# Patient Record
Sex: Male | Born: 1975 | Race: Black or African American | Hispanic: No | Marital: Single | Smoking: Never smoker
Health system: Southern US, Community
[De-identification: ages and names within clinical notes are randomized; demographics above are authoritative.]

## PROBLEM LIST (undated history)

## (undated) DIAGNOSIS — N451 Epididymitis: Secondary | ICD-10-CM

## (undated) DIAGNOSIS — I10 Essential (primary) hypertension: Secondary | ICD-10-CM

## (undated) DIAGNOSIS — R972 Elevated prostate specific antigen [PSA]: Secondary | ICD-10-CM

---

## 1998-12-14 ENCOUNTER — Emergency Department (HOSPITAL_COMMUNITY): Admission: EM | Admit: 1998-12-14 | Discharge: 1998-12-14 | Payer: Self-pay | Admitting: Emergency Medicine

## 2002-12-11 ENCOUNTER — Emergency Department (HOSPITAL_COMMUNITY): Admission: EM | Admit: 2002-12-11 | Discharge: 2002-12-12 | Payer: Self-pay | Admitting: Emergency Medicine

## 2008-03-09 ENCOUNTER — Emergency Department (HOSPITAL_COMMUNITY): Admission: EM | Admit: 2008-03-09 | Discharge: 2008-03-09 | Payer: Self-pay | Admitting: Emergency Medicine

## 2010-11-16 ENCOUNTER — Encounter (INDEPENDENT_AMBULATORY_CARE_PROVIDER_SITE_OTHER): Payer: Self-pay | Admitting: *Deleted

## 2010-11-16 LAB — CONVERTED CEMR LAB
LDL Cholesterol: 109 mg/dL — ABNORMAL HIGH (ref 0–99)
Triglycerides: 109 mg/dL (ref <150)

## 2011-05-24 LAB — URINALYSIS, ROUTINE W REFLEX MICROSCOPIC
Bilirubin Urine: NEGATIVE
Nitrite: NEGATIVE
Specific Gravity, Urine: 1.024
pH: 5.5

## 2011-05-24 LAB — URINE MICROSCOPIC-ADD ON

## 2011-09-24 ENCOUNTER — Emergency Department (INDEPENDENT_AMBULATORY_CARE_PROVIDER_SITE_OTHER): Payer: PRIVATE HEALTH INSURANCE

## 2011-09-24 ENCOUNTER — Other Ambulatory Visit: Payer: Self-pay

## 2011-09-24 ENCOUNTER — Emergency Department (HOSPITAL_BASED_OUTPATIENT_CLINIC_OR_DEPARTMENT_OTHER)
Admission: EM | Admit: 2011-09-24 | Discharge: 2011-09-24 | Disposition: A | Discharge status: Home / Self Care | Payer: PRIVATE HEALTH INSURANCE | Attending: Emergency Medicine | Admitting: Emergency Medicine

## 2011-09-24 ENCOUNTER — Encounter (HOSPITAL_BASED_OUTPATIENT_CLINIC_OR_DEPARTMENT_OTHER): Payer: Self-pay | Admitting: *Deleted

## 2011-09-24 DIAGNOSIS — R0609 Other forms of dyspnea: Secondary | ICD-10-CM

## 2011-09-24 DIAGNOSIS — F17200 Nicotine dependence, unspecified, uncomplicated: Secondary | ICD-10-CM

## 2011-09-24 DIAGNOSIS — R091 Pleurisy: Secondary | ICD-10-CM | POA: Insufficient documentation

## 2011-09-24 DIAGNOSIS — R0602 Shortness of breath: Secondary | ICD-10-CM

## 2011-09-24 DIAGNOSIS — J9819 Other pulmonary collapse: Secondary | ICD-10-CM

## 2011-09-24 LAB — BASIC METABOLIC PANEL
BUN: 17 mg/dL (ref 6–23)
CO2: 27 mEq/L (ref 19–32)
Chloride: 106 mEq/L (ref 96–112)
Glucose, Bld: 85 mg/dL (ref 70–99)
Potassium: 4.1 mEq/L (ref 3.5–5.1)

## 2011-09-24 MED ORDER — OXYCODONE-ACETAMINOPHEN 5-325 MG PO TABS: 1.0000 | ORAL_TABLET | ORAL | Status: AC | PRN

## 2011-09-24 NOTE — ED Notes (Signed)
Pt sts he had a cold or the flu 2 weeks ago and symptoms have resolved. Pt sts the SOB returned a couple of days ago.

## 2011-09-24 NOTE — ED Provider Notes (Signed)
History     CSN: [id number]  Arrival date & time 09/24/11  0127   First MD Initiated Contact with Patient 09/24/11 0305      Chief Complaint  Patient presents with  . Shortness of Breath     Patient is a 36 y.o. male presenting with shortness of breath. The history is provided by the patient.  Shortness of Breath  The current episode started 3 to 5 days ago. The problem occurs rarely. The problem has been unchanged. The problem is mild. The symptoms are relieved by nothing. The symptoms are aggravated by nothing. Associated symptoms include rhinorrhea, cough and shortness of breath. Pertinent negatives include no chest pain, no chest pressure and no fever.  pt reports recent flu like illness with cough and rhinorrhea He reports his cough continues (mostly nonproductive) and now notices SOB for past several days He does not report dyspnea on exertion No orthopnea reported He has no anterior chest pain, but he does have left posterior rib pain mostly with deep inspiration No back pain with palpation No diaphoresis reported No h/o CAD/PE No recent travel/surgery reported  PMH - none  History reviewed. No pertinent past surgical history.  No family history on file.  History  Substance Use Topics  . Smoking status: Never Smoker   . Smokeless tobacco: Not on file  . Alcohol Use: No      Review of Systems  Constitutional: Negative for fever.  HENT: Positive for rhinorrhea.   Respiratory: Positive for cough and shortness of breath.   Cardiovascular: Negative for chest pain.  All other systems reviewed and are negative.    Allergies  Review of patient's allergies indicates no known allergies.  Home Medications   Current Outpatient Rx  Name Route Sig Dispense Refill  . OXYCODONE-ACETAMINOPHEN 5-325 MG PO TABS Oral Take 1 tablet by mouth every 4 (four) hours as needed for pain. 15 tablet 0    BP 134/68  Pulse 89  Temp(Src) 98.7 F (37.1 C) (Oral)  Resp 20  SpO2  96%  Physical Exam CONSTITUTIONAL: Well developed/well nourished HEAD AND FACE: Normocephalic/atraumatic EYES: EOMI/PERRL ENMT: Mucous membranes moist NECK: supple no meningeal signs SPINE:entire spine nontender No tenderness noted to paraspinal region CV: S1/S2 noted, no murmurs/rubs/gallops noted Chest - nontender to palpation LUNGS: Lungs are clear to auscultation bilaterally, no apparent distress ABDOMEN: soft, nontender, no rebound or guarding GU:no cva tenderness NEURO: Pt is awake/alert, moves all extremitiesx4 EXTREMITIES: pulses normal, full ROM, no edema noted to LE SKIN: warm, color normal PSYCH: no abnormalities of mood noted  ED Course  Procedures   Labs Reviewed  BASIC METABOLIC PANEL - Abnormal; Notable for the following:    GFR calc non Af Amer 76 (*)    GFR calc Af Amer 88 (*)    All other components within normal limits  D-DIMER, QUANTITATIVE   Dg Chest 2 View  09/24/2011  *RADIOLOGY REPORT*  Clinical Data: Shortness of breath, smoker, dyspnea  CHEST - 2 VIEW  Comparison: None  Findings: Normal heart size, mediastinal contours, and pulmonary vascularity. Peribronchial thickening. Decreased lung volumes with minimal basilar atelectasis. No definite infiltrate, pleural effusion, or pneumothorax. No acute osseous findings.  IMPRESSION: Minimal bronchitic changes and basilar atelectasis.  Original Report Authenticated By: Lollie Marrow, M.D.     1. Pleurisy      Pt well appearing,resting comfortably, low risk for PE and this ruled out by ddimer Doubt ACS at this time . Doubt occult pneumonia BP 134/68  Pulse 89  Temp(Src) 98.7 F (37.1 C) (Oral)  Resp 20  SpO2 96% Stable for d/c  The patient appears reasonably screened and/or stabilized for discharge and I doubt any other medical condition or other [facility] requiring further screening, evaluation, or treatment in the ED at this time prior to discharge.    MDM  Nursing notes reviewed and considered in  documentation All labs/vitals reviewed and considered xrays reviewed and considered  Results for orders placed during the hospital encounter of 09/24/11  BASIC METABOLIC PANEL      Component Value Range   Sodium 142  135 - 145 (mEq/L)   Potassium 4.1  3.5 - 5.1 (mEq/L)   Chloride 106  96 - 112 (mEq/L)   CO2 27  19 - 32 (mEq/L)   Glucose, Bld 85  70 - 99 (mg/dL)   BUN 17  6 - 23 (mg/dL)   Creatinine, Ser 1.61  0.50 - 1.35 (mg/dL)   Calcium 9.6  8.4 - 09.6 (mg/dL)   GFR calc non Af Amer 76 (*) >90 (mL/min)   GFR calc Af Amer 88 (*) >90 (mL/min)  D-DIMER, QUANTITATIVE      Component Value Range   D-Dimer, Quant <0.22  0.00 - 0.48 (ug/mL-FEU)         Date: 09/24/2011  Rate: 83  Rhythm: normal sinus rhythm  QRS Axis: normal  Intervals: normal  ST/T Wave abnormalities: inverted t waves inferiorly  Conduction Disutrbances:none  Narrative Interpretation:   Old EKG Reviewed: none available Baseline artifact but no acute process noted on EKG   Joya Gaskins, MD 09/24/11 [phone]

## 2011-09-24 NOTE — ED Notes (Signed)
Pt, discharged to home

## 2012-10-02 ENCOUNTER — Encounter (HOSPITAL_BASED_OUTPATIENT_CLINIC_OR_DEPARTMENT_OTHER): Payer: Self-pay

## 2012-10-02 ENCOUNTER — Emergency Department (HOSPITAL_BASED_OUTPATIENT_CLINIC_OR_DEPARTMENT_OTHER)
Admission: EM | Admit: 2012-10-02 | Discharge: 2012-10-02 | Disposition: A | Discharge status: Home / Self Care | Payer: Self-pay | Attending: Emergency Medicine | Admitting: Emergency Medicine

## 2012-10-02 DIAGNOSIS — N451 Epididymitis: Secondary | ICD-10-CM

## 2012-10-02 DIAGNOSIS — N453 Epididymo-orchitis: Secondary | ICD-10-CM | POA: Insufficient documentation

## 2012-10-02 LAB — URINALYSIS, ROUTINE W REFLEX MICROSCOPIC
Glucose, UA: 100 mg/dL — AB
Hgb urine dipstick: NEGATIVE
Specific Gravity, Urine: 1.029 (ref 1.005–1.030)
Urobilinogen, UA: 0.2 mg/dL (ref 0.0–1.0)
pH: 5.5 (ref 5.0–8.0)

## 2012-10-02 MED ORDER — TRAMADOL HCL 50 MG PO TABS: 50.0000 mg | ORAL_TABLET | Freq: Four times a day (QID) | ORAL | Status: DC | PRN

## 2012-10-02 MED ORDER — IBUPROFEN 600 MG PO TABS: 600.0000 mg | ORAL_TABLET | Freq: Four times a day (QID) | ORAL | Status: DC | PRN

## 2012-10-02 MED ORDER — DOXYCYCLINE HYCLATE 100 MG PO CAPS: 100.0000 mg | ORAL_CAPSULE | Freq: Two times a day (BID) | ORAL | Status: DC

## 2012-10-02 MED ORDER — LIDOCAINE HCL (PF) 1 % IJ SOLN
INTRAMUSCULAR | Status: AC
  Administered 2012-10-02: 2 mL
  Filled 2012-10-02: qty 5

## 2012-10-02 MED ORDER — CEFTRIAXONE SODIUM 250 MG IJ SOLR
250.0000 mg | Freq: Once | INTRAMUSCULAR | Status: AC
  Administered 2012-10-02: 250 mg via INTRAMUSCULAR
  Filled 2012-10-02: qty 250

## 2012-10-02 NOTE — ED Notes (Signed)
MD at bedside. 

## 2012-10-02 NOTE — ED Provider Notes (Signed)
History  This chart was scribed for Jake Racer, MD by Bennett Scrape, ED Scribe. This patient was seen in room MH06/[facility]06 and the patient's care was started at 10:20 PM.  CSN: [id number]  Arrival date & time 10/02/12  2208   First MD Initiated Contact with Patient 10/02/12 2220      Chief Complaint  Patient presents with  . Testicle Pain    Patient is a 37 y.o. male presenting with testicular pain. The history is provided by the patient. No language interpreter was used.  Testicle Pain This is a new problem. The current episode started more than 2 days ago. The problem occurs constantly. The problem has been gradually worsening. The symptoms are aggravated by walking. The symptoms are relieved by rest. He has tried nothing for the symptoms.    Jake Lopez is a 37 y.o. male who presents to the Emergency Department complaining of 4 days of gradual onset, gradually worsening, constant left testicular pain. He states that walking aggravates the pain and rest improves the pain. He denies any recent trauma or falls. He states that he is sexually active and uses protection "sometimes". He reports having a h/o STDs with similar symptoms in the past. He denies fevers, chills, penile discharge or urinary symptoms. He does not have a h/o chronic medical conditions and denies smoking and alcohol use.  History reviewed. No pertinent past medical history.  History reviewed. No pertinent past surgical history.  No family history on file.  History  Substance Use Topics  . Smoking status: Never Smoker   . Smokeless tobacco: Not on file  . Alcohol Use: No      Review of Systems  Constitutional: Negative for fever and chills.  Gastrointestinal: Negative for nausea and vomiting.  Genitourinary: Positive for testicular pain (left). Negative for dysuria, discharge and penile pain.  Musculoskeletal: Negative for back pain.  All other systems reviewed and are negative.    Allergies   Review of patient's allergies indicates no known allergies.  Home Medications   Current Outpatient Rx  Name  Route  Sig  Dispense  Refill  . DOXYCYCLINE HYCLATE 100 MG PO CAPS   Oral   Take 1 capsule (100 mg total) by mouth 2 (two) times daily.   20 capsule   0   . IBUPROFEN 600 MG PO TABS   Oral   Take 1 tablet (600 mg total) by mouth every 6 (six) hours as needed for pain.   30 tablet   0   . TRAMADOL HCL 50 MG PO TABS   Oral   Take 1 tablet (50 mg total) by mouth every 6 (six) hours as needed for pain.   15 tablet   0     Triage Vitals: BP 184/100  Pulse 90  Temp 98.5 F (36.9 C) (Oral)  Resp 16  Ht 6' (1.829 m)  Wt 250 lb (113.399 kg)  BMI 33.91 kg/m2  SpO2 100%  Physical Exam  Nursing note and vitals reviewed. Constitutional: He is oriented to person, place, and time. He appears well-developed and well-nourished. No distress.  HENT:  Head: Normocephalic and atraumatic.  Eyes: EOM are normal.  Neck: Neck supple. No tracheal deviation present.  Cardiovascular: Normal rate.   Pulmonary/Chest: Effort normal. No respiratory distress.  Genitourinary:       Mild lateral testicular tenderness to palpation, mild penile discharge, testicles are in a normal lie, no signs of cellulitis, cremasteric reflex intact, negative phren's sign  Musculoskeletal: Normal  range of motion.  Neurological: He is alert and oriented to person, place, and time.  Skin: Skin is warm and dry.  Psychiatric: He has a normal mood and affect. His behavior is normal.    ED Course  Procedures (including critical care time)  DIAGNOSTIC STUDIES: Oxygen Saturation is 100% on room air, normal by my interpretation.    COORDINATION OF CARE: 10:24 PM-Advised pt that I don't believe his symptoms are consistent with torsion. Discussed discharge plan which includes antibiotics, pain medication and f/u urology with pt at bedside and pt agreed to plan. Advised pt to return for worsening symptoms or  development of fevers.   Labs Reviewed  URINALYSIS, ROUTINE W REFLEX MICROSCOPIC - Abnormal; Notable for the following:    Glucose, UA 100 (*)     All other components within normal limits   No results found.   1. Epididymitis       MDM  I personally performed the services described in this documentation, which was scribed in my presence. The recorded information has been reviewed and is accurate.        Jake Racer, MD 10/02/12 2250

## 2012-10-02 NOTE — ED Notes (Signed)
Pt complains of left testicle pain that started on Tuesday.  Denies injury.  States "just woke up and it was hurting"

## 2013-02-22 ENCOUNTER — Emergency Department (HOSPITAL_BASED_OUTPATIENT_CLINIC_OR_DEPARTMENT_OTHER): Payer: Self-pay

## 2013-02-22 ENCOUNTER — Encounter (HOSPITAL_BASED_OUTPATIENT_CLINIC_OR_DEPARTMENT_OTHER): Payer: Self-pay

## 2013-02-22 ENCOUNTER — Emergency Department (HOSPITAL_BASED_OUTPATIENT_CLINIC_OR_DEPARTMENT_OTHER)
Admission: EM | Admit: 2013-02-22 | Discharge: 2013-02-22 | Disposition: A | Discharge status: Home / Self Care | Payer: Self-pay | Attending: Emergency Medicine | Admitting: Emergency Medicine

## 2013-02-22 DIAGNOSIS — R1032 Left lower quadrant pain: Secondary | ICD-10-CM | POA: Insufficient documentation

## 2013-02-22 LAB — BASIC METABOLIC PANEL
CO2: 25 mEq/L (ref 19–32)
Chloride: 104 mEq/L (ref 96–112)
GFR calc non Af Amer: 68 mL/min — ABNORMAL LOW (ref >90)
Glucose, Bld: 104 mg/dL — ABNORMAL HIGH (ref 70–99)
Potassium: 4 mEq/L (ref 3.5–5.1)
Sodium: 141 mEq/L (ref 135–145)

## 2013-02-22 LAB — URINALYSIS, ROUTINE W REFLEX MICROSCOPIC
Hgb urine dipstick: NEGATIVE
Specific Gravity, Urine: 1.037 — ABNORMAL HIGH (ref 1.005–1.030)
Urobilinogen, UA: 0.2 mg/dL (ref 0.0–1.0)
pH: 5.5 (ref 5.0–8.0)

## 2013-02-22 MED ORDER — AZITHROMYCIN 250 MG PO TABS
1000.0000 mg | ORAL_TABLET | Freq: Once | ORAL | Status: AC
  Administered 2013-02-22: 1000 mg via ORAL
  Filled 2013-02-22: qty 4

## 2013-02-22 MED ORDER — HYDROCODONE-ACETAMINOPHEN 5-325 MG PO TABS: 1.0000 | ORAL_TABLET | Freq: Four times a day (QID) | ORAL | Status: DC | PRN

## 2013-02-22 MED ORDER — AZITHROMYCIN 1 G PO PACK: 1.0000 g | PACK | Freq: Once | ORAL | Status: DC

## 2013-02-22 MED ORDER — CEFTRIAXONE SODIUM 250 MG IJ SOLR
250.0000 mg | Freq: Once | INTRAMUSCULAR | Status: AC
  Administered 2013-02-22: 250 mg via INTRAMUSCULAR
  Filled 2013-02-22: qty 250

## 2013-02-22 MED ORDER — KETOROLAC TROMETHAMINE 60 MG/2ML IM SOLN
60.0000 mg | Freq: Once | INTRAMUSCULAR | Status: AC
  Administered 2013-02-22: 60 mg via INTRAMUSCULAR
  Filled 2013-02-22: qty 2

## 2013-02-22 MED ORDER — DOXYCYCLINE HYCLATE 100 MG PO CAPS: 100.0000 mg | ORAL_CAPSULE | Freq: Two times a day (BID) | ORAL | Status: DC

## 2013-02-22 NOTE — ED Provider Notes (Signed)
History    CSN: [id number] Arrival date & time 02/22/13  0254  First MD Initiated Contact with Patient 02/22/13 [phone]     Chief Complaint  Patient presents with  . Abdominal Pain   (Consider location/radiation/quality/duration/timing/severity/associated sxs/prior Treatment) Patient is a 37 y.o. male presenting with abdominal pain. The history is provided by the patient.  Abdominal Pain This is a recurrent problem. The current episode started yesterday. The problem occurs constantly. The problem has not changed since onset.Associated symptoms include abdominal pain. Nothing aggravates the symptoms. Nothing relieves the symptoms. He has tried nothing for the symptoms. The treatment provided no relief.  Pain is LLQ that radiated to the inguinal area and left testicle History reviewed. No pertinent past medical history. History reviewed. No pertinent past surgical history. No family history on file. History  Substance Use Topics  . Smoking status: Never Smoker   . Smokeless tobacco: Not on file  . Alcohol Use: No    Review of Systems  Gastrointestinal: Positive for abdominal pain.  Genitourinary: Negative for frequency, hematuria, genital sores and penile pain.  All other systems reviewed and are negative.    Allergies  Review of patient's allergies indicates no known allergies.  Home Medications  No current outpatient prescriptions on file. BP 155/101  Pulse 60  Temp(Src) 98.4 F (36.9 C) (Oral)  Resp 18  Wt 248 lb (112.492 kg)  BMI 33.63 kg/m2  SpO2 100% Physical Exam  Constitutional: He is oriented to person, place, and time. He appears well-developed and well-nourished. No distress.  HENT:  Head: Normocephalic and atraumatic.  Mouth/Throat: Oropharynx is clear and moist.  Eyes: Conjunctivae are normal. Pupils are equal, round, and reactive to light.  Neck: Normal range of motion. Neck supple.  Cardiovascular: Normal rate, regular rhythm and intact distal pulses.    Pulmonary/Chest: Effort normal and breath sounds normal.  Abdominal: Soft. Bowel sounds are increased. There is no tenderness. There is no rebound and no guarding.    Genitourinary:  B testicles vertically aligned, not swollen non tender, no masses, positive cremasteric reflex.  No hernias  Musculoskeletal: Normal range of motion.  Neurological: He is alert and oriented to person, place, and time.  Skin: Skin is warm and dry.  Psychiatric: He has a normal mood and affect.    ED Course  Procedures (including critical care time) Labs Reviewed  URINALYSIS, ROUTINE W REFLEX MICROSCOPIC - Abnormal; Notable for the following:    Specific Gravity, Urine 1.037 (*)    All other components within normal limits  BASIC METABOLIC PANEL   Ct Abdomen Pelvis Wo Contrast  02/22/2013   *RADIOLOGY REPORT*  Clinical Data: Abdominal pain.  Left lower quadrant pain.  CT ABDOMEN AND PELVIS WITHOUT CONTRAST  Technique:  Multidetector CT imaging of the abdomen and pelvis was performed following the standard protocol without intravenous contrast.  Comparison: None.  Findings: Lung Bases: Subsegmental atelectasis.  Liver:  Unenhanced CT was performed per clinician order.  Lack of IV contrast limits sensitivity and specificity, especially for evaluation of abdominal/pelvic solid viscera.  Grossly normal.  Spleen:  Normal.  Gallbladder:  Contracted.  Common bile duct:  Normal.  Pancreas:  Normal.  Adrenal glands:  Normal.  Kidneys:  No renal calculi or hydronephrosis.  Both ureters are within normal limits.  Stomach:  Normal.  Small bowel:  Normal.  Nonspecific misty mesentery.  No adenopathy. No obstruction.  Colon:   Normal appendix.  No inflammatory changes of colon.  Pelvic Genitourinary:  Decompressed  urinary bladder.  No adenopathy.  No free fluid.  Bones:  No aggressive osseous lesions.  Vasculature: Grossly normal.  Body Wall: Normal.  IMPRESSION: No acute abnormality.   Original Report Authenticated By: Andreas Newport, M.D.   1. Inguinal pain, lower left quadrant     MDM  No penile discharge but has had unprotected encounters will treat.  Will provide pain medication.  I do not believe the pain is torsion nor epidydimitis.  I believe the pain is referred from the [facility]    Jillane Po Smitty Cords, MD 02/22/13 [phone]

## 2013-02-22 NOTE — ED Notes (Signed)
Patient here with LLQ abdominal pain and pain to testicles since early yesterday. No nausea, no urinary symptoms. Reports similar event in past and took antibiotics.

## 2013-11-30 ENCOUNTER — Emergency Department (HOSPITAL_BASED_OUTPATIENT_CLINIC_OR_DEPARTMENT_OTHER)
Admission: EM | Admit: 2013-11-30 | Discharge: 2013-11-30 | Disposition: A | Discharge status: Home / Self Care | Payer: Self-pay | Attending: Emergency Medicine | Admitting: Emergency Medicine

## 2013-11-30 ENCOUNTER — Encounter (HOSPITAL_BASED_OUTPATIENT_CLINIC_OR_DEPARTMENT_OTHER): Payer: Self-pay | Admitting: Emergency Medicine

## 2013-11-30 DIAGNOSIS — N453 Epididymo-orchitis: Secondary | ICD-10-CM | POA: Insufficient documentation

## 2013-11-30 DIAGNOSIS — N451 Epididymitis: Secondary | ICD-10-CM

## 2013-11-30 LAB — URINALYSIS, ROUTINE W REFLEX MICROSCOPIC
BILIRUBIN URINE: NEGATIVE
Glucose, UA: NEGATIVE mg/dL
HGB URINE DIPSTICK: NEGATIVE
KETONES UR: NEGATIVE mg/dL
Leukocytes, UA: NEGATIVE
NITRITE: NEGATIVE
PH: 5.5 (ref 5.0–8.0)
Protein, ur: NEGATIVE mg/dL
SPECIFIC GRAVITY, URINE: 1.025 (ref 1.005–1.030)
Urobilinogen, UA: 0.2 mg/dL (ref 0.0–1.0)

## 2013-11-30 LAB — GC/CHLAMYDIA PROBE AMP
CT Probe RNA: NEGATIVE
GC PROBE AMP APTIMA: NEGATIVE

## 2013-11-30 MED ORDER — DOXYCYCLINE HYCLATE 100 MG PO CAPS: 100.0000 mg | ORAL_CAPSULE | Freq: Two times a day (BID) | ORAL | Status: DC

## 2013-11-30 MED ORDER — AZITHROMYCIN 1 G PO PACK
1.0000 g | PACK | Freq: Once | ORAL | Status: AC
  Administered 2013-11-30: 1 g via ORAL
  Filled 2013-11-30: qty 1

## 2013-11-30 MED ORDER — NAPROXEN 500 MG PO TABS: 500.0000 mg | ORAL_TABLET | Freq: Two times a day (BID) | ORAL | Status: DC

## 2013-11-30 MED ORDER — CEFTRIAXONE SODIUM 250 MG IJ SOLR
250.0000 mg | Freq: Once | INTRAMUSCULAR | Status: AC
  Administered 2013-11-30: 250 mg via INTRAMUSCULAR
  Filled 2013-11-30: qty 250

## 2013-11-30 NOTE — ED Provider Notes (Signed)
CSN: [id number]     Arrival date & time 11/30/13  0052 History   First MD Initiated Contact with Patient 11/30/13 0112     Chief Complaint  Patient presents with  . Testicle Pain     (Consider location/radiation/quality/duration/timing/severity/associated sxs/prior Treatment) Patient is a 38 y.o. male presenting with testicular pain. The history is provided by the patient.  Testicle Pain This is a recurrent problem. The current episode started more than 1 week ago. The problem occurs constantly. The problem has not changed since onset.Pertinent negatives include no chest pain, no abdominal pain, no headaches and no shortness of breath. Nothing aggravates the symptoms. Nothing relieves the symptoms. He has tried nothing for the symptoms. The treatment provided no relief.    History reviewed. No pertinent past medical history. History reviewed. No pertinent past surgical history. History reviewed. No pertinent family history. History  Substance Use Topics  . Smoking status: Never Smoker   . Smokeless tobacco: Not on file  . Alcohol Use: No    Review of Systems  Respiratory: Negative for shortness of breath.   Cardiovascular: Negative for chest pain.  Gastrointestinal: Negative for abdominal pain.  Genitourinary: Positive for testicular pain. Negative for frequency, discharge, penile swelling and difficulty urinating.  Neurological: Negative for headaches.  All other systems reviewed and are negative.      Allergies  Review of patient's allergies indicates no known allergies.  Home Medications   Current Outpatient Rx  Name  Route  Sig  Dispense  Refill  . doxycycline (VIBRAMYCIN) 100 MG capsule   Oral   Take 1 capsule (100 mg total) by mouth 2 (two) times daily. One po bid x 7 days   14 capsule   0   . HYDROcodone-acetaminophen (NORCO) 5-325 MG per tablet   Oral   Take 1 tablet by mouth every 6 (six) hours as needed for pain.   10 tablet   0    BP 161/111  Pulse  68  Temp(Src) 98.5 F (36.9 C) (Oral)  Resp 18  SpO2 95% Physical Exam  Constitutional: He is oriented to person, place, and time. He appears well-developed and well-nourished. No distress.  HENT:  Head: Normocephalic and atraumatic.  Eyes: Conjunctivae and EOM are normal.  Neck: Normal range of motion. Neck supple.  Cardiovascular: Normal rate, regular rhythm and intact distal pulses.   Pulmonary/Chest: Effort normal and breath sounds normal. He has no wheezes. He has no rales.  Abdominal: Soft. Bowel sounds are normal. There is no tenderness. There is no rebound and no guarding.  Genitourinary: Penis normal. No penile tenderness.  Chaperone present.  Has cremasteric reflex.  No swelling no discharge  Musculoskeletal: Normal range of motion.  Neurological: He is alert and oriented to person, place, and time.  Skin: Skin is warm and dry.  Psychiatric: He has a normal mood and affect.    ED Course  Procedures (including critical care time) Labs Review Labs Reviewed  URINALYSIS, ROUTINE W REFLEX MICROSCOPIC   Imaging Review No results found.   EKG Interpretation None      MDM   Final diagnoses:  None    Symptoms consistent with epidydimitis.  As patient has been treated for STI and has had unprotected encounters will treat again.  Follow up with urology no sexual activity until all partners treated    Teodor Prater Smitty CordsK Cedarius Kersh-Rasch, MD 11/30/13 [id number]

## 2013-11-30 NOTE — Discharge Instructions (Signed)
Epididymitis  Epididymitis is a swelling (inflammation) of the epididymis. The epididymis is a cord-like structure along the back part of the testicle. Epididymitis is usually, but not always, caused by infection. This is usually a sudden problem beginning with chills, fever and pain behind the scrotum and in the testicle. There may be swelling and redness of the testicle.  DIAGNOSIS   Physical examination will reveal a tender, swollen epididymis. Sometimes, cultures are obtained from the urine or from prostate secretions to help find out if there is an infection or if the cause is a different problem. Sometimes, blood work is performed to see if your white blood cell count is elevated and if a germ (bacterial) or viral infection is present. Using this knowledge, an appropriate medicine which kills germs (antibiotic) can be chosen by your caregiver. A viral infection causing epididymitis will most often go away (resolve) without treatment.  HOME CARE INSTRUCTIONS   · Hot sitz baths for 20 minutes, 4 times per day, may help relieve pain.  · Only take over-the-counter or prescription medicines for pain, discomfort or fever as directed by your caregiver.  · Take all medicines, including antibiotics, as directed. Take the antibiotics for the full prescribed length of time even if you are feeling better.  · It is very important to keep all follow-up appointments.  SEEK IMMEDIATE MEDICAL CARE IF:   · You have a fever.  · You have pain not relieved with medicines.  · You have any worsening of your problems.  · Your pain seems to come and go.  · You develop pain, redness, and swelling in the scrotum and surrounding areas.  MAKE SURE YOU:   · Understand these instructions.  · Will watch your condition.  · Will get help right away if you are not doing well or get worse.  Document Released: 08/09/2000 Document Revised: 11/04/2011 Document Reviewed: 06/29/2009  [employer]® Patient Information ©2014 [employer].

## 2013-11-30 NOTE — ED Notes (Signed)
Pt states that from last Monday he has had left testicular pain, left neck pain, and left leg pain.  Pt reports that he has had these s/s before and was told it was a STD.

## 2014-07-06 ENCOUNTER — Encounter (HOSPITAL_BASED_OUTPATIENT_CLINIC_OR_DEPARTMENT_OTHER): Payer: Self-pay

## 2014-07-06 ENCOUNTER — Emergency Department (HOSPITAL_BASED_OUTPATIENT_CLINIC_OR_DEPARTMENT_OTHER)
Admission: EM | Admit: 2014-07-06 | Discharge: 2014-07-06 | Disposition: A | Discharge status: Home / Self Care | Payer: Self-pay | Attending: Emergency Medicine | Admitting: Emergency Medicine

## 2014-07-06 ENCOUNTER — Emergency Department (HOSPITAL_BASED_OUTPATIENT_CLINIC_OR_DEPARTMENT_OTHER): Payer: Self-pay

## 2014-07-06 DIAGNOSIS — R6883 Chills (without fever): Secondary | ICD-10-CM | POA: Insufficient documentation

## 2014-07-06 DIAGNOSIS — Z791 Long term (current) use of non-steroidal anti-inflammatories (NSAID): Secondary | ICD-10-CM | POA: Insufficient documentation

## 2014-07-06 DIAGNOSIS — Z792 Long term (current) use of antibiotics: Secondary | ICD-10-CM | POA: Insufficient documentation

## 2014-07-06 DIAGNOSIS — R110 Nausea: Secondary | ICD-10-CM | POA: Insufficient documentation

## 2014-07-06 DIAGNOSIS — N50819 Testicular pain, unspecified: Secondary | ICD-10-CM

## 2014-07-06 DIAGNOSIS — N451 Epididymitis: Secondary | ICD-10-CM | POA: Insufficient documentation

## 2014-07-06 LAB — URINALYSIS, ROUTINE W REFLEX MICROSCOPIC
Bilirubin Urine: NEGATIVE
GLUCOSE, UA: 250 mg/dL — AB
Hgb urine dipstick: NEGATIVE
KETONES UR: NEGATIVE mg/dL
LEUKOCYTES UA: NEGATIVE
NITRITE: NEGATIVE
PH: 5.5 (ref 5.0–8.0)
Protein, ur: NEGATIVE mg/dL
SPECIFIC GRAVITY, URINE: 1.024 (ref 1.005–1.030)
Urobilinogen, UA: 0.2 mg/dL (ref 0.0–1.0)

## 2014-07-06 MED ORDER — HYDROCODONE-ACETAMINOPHEN 5-325 MG PO TABS: 1.0000 | ORAL_TABLET | Freq: Four times a day (QID) | ORAL | Status: DC | PRN

## 2014-07-06 MED ORDER — DOXYCYCLINE HYCLATE 100 MG PO CAPS: 100.0000 mg | ORAL_CAPSULE | Freq: Two times a day (BID) | ORAL | Status: DC

## 2014-07-06 NOTE — ED Provider Notes (Signed)
CSN: [id number]     Arrival date & time 07/06/14  0720 History   First MD Initiated Contact with Patient 07/06/14 [id number]     Chief Complaint  Patient presents with  . Testicle Pain     (Consider location/radiation/quality/duration/timing/severity/associated sxs/prior Treatment) HPI Comments: Pt states that this same thing happened 2 other times this year which improved with abx.  Denies dysuria, hematuria, frequency or urgency.  Sometimes notices some left testicular swelling but no constantly.  No pain with bm's.  Patient is a 38 y.o. male presenting with testicular pain. The history is provided by the patient.  Testicle Pain This is a recurrent problem. Episode onset: 2 weeks. The problem occurs constantly. The problem has been gradually worsening. Associated symptoms include abdominal pain. Pertinent negatives include no chest pain, no headaches and no shortness of breath. Associated symptoms comments: Since the pain in the testicle has worsened it has caused some left lower abd pain and intermittent nausea.  Chills but no fever. Exacerbated by: rest and lying down. Relieved by: does not notice as much when working. Treatments tried: nsaids. The treatment provided no relief.    History reviewed. No pertinent past medical history. History reviewed. No pertinent past surgical history. No family history on file. History  Substance Use Topics  . Smoking status: Never Smoker   . Smokeless tobacco: Not on file  . Alcohol Use: No    Review of Systems  Constitutional: Positive for chills.  Respiratory: Negative for shortness of breath.   Cardiovascular: Negative for chest pain.  Gastrointestinal: Positive for nausea and abdominal pain. Negative for diarrhea and blood in stool.  Genitourinary: Positive for testicular pain. Negative for dysuria, frequency, hematuria, flank pain, discharge, genital sores and penile pain.  Musculoskeletal: Positive for back pain.       Left upper back pain  worse with lifting  Neurological: Negative for headaches.  All other systems reviewed and are negative.     Allergies  Review of patient's allergies indicates no known allergies.  Home Medications   Prior to Admission medications   Medication Sig Start Date End Date Taking? Authorizing Provider  doxycycline (VIBRAMYCIN) 100 MG capsule Take 1 capsule (100 mg total) by mouth 2 (two) times daily. One po bid x 7 days 02/22/13   April K Palumbo-Rasch, MD  doxycycline (VIBRAMYCIN) 100 MG capsule Take 1 capsule (100 mg total) by mouth 2 (two) times daily. One po bid x 10 days 11/30/13   April K Palumbo-Rasch, MD  HYDROcodone-acetaminophen [facility]) 5-325 MG per tablet Take 1 tablet by mouth every 6 (six) hours as needed for pain. 02/22/13   April K Palumbo-Rasch, MD  naproxen (NAPROSYN) 500 MG tablet Take 1 tablet (500 mg total) by mouth 2 (two) times daily. 11/30/13   April K Palumbo-Rasch, MD   BP 158/99 mmHg  Pulse 70  Temp(Src) 98 F (36.7 C) (Oral)  Resp 20  Ht 6' (1.829 m)  Wt 250 lb (113.399 kg)  BMI 33.90 kg/m2  SpO2 100% Physical Exam  Constitutional: He is oriented to person, place, and time. He appears well-developed and well-nourished. No distress.  HENT:  Head: Normocephalic and atraumatic.  Mouth/Throat: Oropharynx is clear and moist.  Eyes: Conjunctivae and EOM are normal. Pupils are equal, round, and reactive to light.  Neck: Normal range of motion. Neck supple.  Cardiovascular: Normal rate, regular rhythm and intact distal pulses.   No murmur heard. Pulmonary/Chest: Effort normal and breath sounds normal. No respiratory distress. He has no  wheezes. He has no rales.  Abdominal: Soft. He exhibits no distension. There is no tenderness. There is no rebound, no guarding and no CVA tenderness. No hernia. Hernia confirmed negative in the left inguinal area.    Mild left lower pelvic pain  Genitourinary: Penis normal. Right testis shows no mass, no swelling and no tenderness.  Right testis is descended. Left testis shows tenderness. Left testis shows no mass and no swelling. Left testis is descended.  Mild tenderness along the posterior aspect of the left testicle  Musculoskeletal: Normal range of motion. He exhibits no edema.       Thoracic back: He exhibits tenderness, pain and spasm. He exhibits no bony tenderness.       Back:  Neurological: He is alert and oriented to person, place, and time.  Skin: Skin is warm and dry. No rash noted. No erythema.  Psychiatric: He has a normal mood and affect. His behavior is normal.  Nursing note and vitals reviewed.   ED Course  Procedures (including critical care time) Labs Review Labs Reviewed  URINALYSIS, ROUTINE W REFLEX MICROSCOPIC - Abnormal; Notable for the following:    Glucose, UA 250 (*)    All other components within normal limits    Imaging Review [place] Scrotum  07/06/2014   CLINICAL DATA:  Left scrotal region pain  EXAM: ULTRASOUND OF SCROTUM  TECHNIQUE: Complete ultrasound examination of the testicles, epididymis, and other scrotal structures was performed.  COMPARISON:  None.  FINDINGS: Right testicle  Measurements: 4.0 x 2.2 x 2.8 cm. No mass or microlithiasis visualized. Color flow is seen in the right testis.  Left testicle  Measurements: 4.0 x 2.1 x 2.9 cm. No mass or microlithiasis visualized. Color flow is seen in the left testis.  Right epididymis:  Normal in size and appearance.  Left epididymis:  Normal in size and appearance.  Hydrocele:  There are minimal hydroceles bilaterally.  Varicocele:  None visualized.  No scrotal wall thickening or abscess on either side.  IMPRESSION: Minimal hydroceles bilaterally. Study otherwise unremarkable. No intratesticular mass or extratesticular mass. No demonstrable torsion or inflammatory focus on either side.   Electronically Signed   By: Bretta BangWilliam  Woodruff M.D.   On: 07/06/2014 08:46     EKG Interpretation None      MDM   Final diagnoses:  Testicle pain      Patient is here complaining of testicular pain has been progressively worsening over the last 2 weeks. He has a prior history of left-sided epididymitis 2 this year. He denies any urinary symptoms and no pain with bowel movements. He states the pain is worse at rest and is now seeming to track into the leg. On exam patient has some mild posterior testicular tenderness without hernia or lesions. Patient has never had an ultrasound that had a CT done in June that was within normal limits. Patient did report that he had back pain however it is upper thoracic back pain consistent with musculoskeletal pain. 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 Feel that this is most likely the cause for his pain. Initially at triage patient states he was having chest pain however he does not mention denies any pain here.  Today's urine is negative except for 250 of glucose. Patient had a BMP done in June that was within normal limits except for a mild elevation in his blood sugar of 104.  Low suspicion for prostatitis and no signs of hernia as  a cause of his pain. Given this his third episode of pain like this will get an ultrasound to ensure there are no other causes for patient's pain.  8:53 AM US unremarkable.  Given sx will treat with doxy and pain control.  Gwyneth SproutWhitney Neel Buffone, MD 07/06/14 [phone]

## 2014-07-06 NOTE — Discharge Instructions (Signed)
Epididymitis °Epididymitis is a swelling (inflammation) of the epididymis. The epididymis is a cord-like structure along the back part of the testicle. Epididymitis is usually, but not always, caused by infection. This is usually a sudden problem beginning with chills, fever and pain behind the scrotum and in the testicle. There may be swelling and redness of the testicle. °DIAGNOSIS  °Physical examination will reveal a tender, swollen epididymis. Sometimes, cultures are obtained from the urine or from prostate secretions to help find out if there is an infection or if the cause is a different problem. Sometimes, blood work is performed to see if your white blood cell count is elevated and if a germ (bacterial) or viral infection is present. Using this knowledge, an appropriate medicine which kills germs (antibiotic) can be chosen by your caregiver. A viral infection causing epididymitis will most often go away (resolve) without treatment. °HOME CARE INSTRUCTIONS  °· Hot sitz baths for 20 minutes, 4 times per day, may help relieve pain. °· Only take over-the-counter or prescription medicines for pain, discomfort or fever as directed by your caregiver. °· Take all medicines, including antibiotics, as directed. Take the antibiotics for the full prescribed length of time even if you are feeling better. °· It is very important to keep all follow-up appointments. °SEEK IMMEDIATE MEDICAL CARE IF:  °· You have a fever. °· You have pain not relieved with medicines. °· You have any worsening of your problems. °· Your pain seems to come and go. °· You develop pain, redness, and swelling in the scrotum and surrounding areas. °MAKE SURE YOU:  °· Understand these instructions. °· Will watch your condition. °· Will get help right away if you are not doing well or get worse. °Document Released: 08/09/2000 Document Revised: 11/04/2011 Document Reviewed: 06/29/2009 °ExitCare® Patient Information ©2015 [employer]. This information  is not intended to replace advice given to you by your health care provider. Make sure you discuss any questions you have with your health care provider. ° °Scrotal Swelling °Scrotal swelling may occur on one or both sides of the scrotum. Pain may also occur with swelling. Possible causes of scrotal swelling include:  °· Injury. °· Infection. °· An ingrown hair or abrasion in the area. °· Repeated rubbing from tight-fitting underwear. °· Poor hygiene. °· A weakened area in the muscles around the groin (hernia). A hernia can allow abdominal contents to push into the scrotum. °· Fluid around the testicle (hydrocele). °· Enlarged vein around the testicle (varicocele). °· Certain medical treatments or existing conditions. °· A recent genital surgery or procedure. °· The spermatic cord becomes twisted in the scrotum, which cuts off blood supply (testicular torsion). °· Testicular cancer. °HOME CARE INSTRUCTIONS °Once the cause of your scrotal swelling has been determined, you may be asked to monitor your scrotum for any changes. The following actions may help to alleviate any discomfort you are experiencing: °· Rest and limit activity until the swelling goes away. Lying down is the preferred position. °· Put ice on the scrotum: °¨ Put ice in a plastic bag. °¨ Place a towel between your skin and the bag. °¨ Leave the ice on for 20 minutes, 2-3 times a day for 1-2 days. °· Place a rolled towel under the testicles for support. °· Wear loose-fitting clothing or an athletic support cup for comfort. °· Take all medicines as directed by your health care provider. °· Perform a monthly self-exam of the scrotum and penis. Feel for changes. Ask your health care provider   how to perform a monthly self-exam if you are unsure. °SEEK MEDICAL CARE IF: °· You have a sudden (acute) onset of pain that is persistent and not improving. °· You notice a heavy feeling or fluid in the scrotum. °· You have pain or burning while urinating. °· You  have blood in the urine or semen. °· You feel a lump around the testicle. °· You notice that one testicle is larger than the other (slight variation is normal). °· You have a persistent dull ache or pain in the groin or scrotum. °SEEK IMMEDIATE MEDICAL CARE IF: °· The pain does not go away or becomes severe. °· You have a fever or shaking chills. °· You have pain or vomiting that cannot be controlled. °· You notice significant redness or swelling of one or both sides of the scrotum. °· You experience redness spreading upward from your scrotum to your abdomen or downward from your scrotum to your thighs. °MAKE SURE YOU: °· Understand these instructions. °· Will watch your condition. °· Will get help right away if you are not doing well or get worse. °Document Released: 09/14/2010 Document Revised: 04/14/2013 Document Reviewed: 01/14/2013 °ExitCare® Patient Information ©2015 [employer]. This information is not intended to replace advice given to you by your health care provider. Make sure you discuss any questions you have with your health care provider. ° °

## 2014-07-06 NOTE — ED Notes (Signed)
Pt reports left testicle pain x 2 weeks.  Reports pain has got worse.  Pt reports hx of same epididymis.  Pt complains of lower back pain and leg pain as well.

## 2016-09-07 ENCOUNTER — Encounter (HOSPITAL_BASED_OUTPATIENT_CLINIC_OR_DEPARTMENT_OTHER): Payer: Self-pay | Admitting: Emergency Medicine

## 2016-09-07 ENCOUNTER — Emergency Department (HOSPITAL_BASED_OUTPATIENT_CLINIC_OR_DEPARTMENT_OTHER)
Admission: EM | Admit: 2016-09-07 | Discharge: 2016-09-07 | Disposition: A | Discharge status: Home / Self Care | Payer: Self-pay | Attending: Emergency Medicine | Admitting: Emergency Medicine

## 2016-09-07 ENCOUNTER — Emergency Department (HOSPITAL_BASED_OUTPATIENT_CLINIC_OR_DEPARTMENT_OTHER): Payer: Self-pay

## 2016-09-07 DIAGNOSIS — R079 Chest pain, unspecified: Secondary | ICD-10-CM | POA: Insufficient documentation

## 2016-09-07 DIAGNOSIS — R531 Weakness: Secondary | ICD-10-CM

## 2016-09-07 DIAGNOSIS — N390 Urinary tract infection, site not specified: Secondary | ICD-10-CM | POA: Insufficient documentation

## 2016-09-07 DIAGNOSIS — R0602 Shortness of breath: Secondary | ICD-10-CM | POA: Insufficient documentation

## 2016-09-07 DIAGNOSIS — M542 Cervicalgia: Secondary | ICD-10-CM | POA: Insufficient documentation

## 2016-09-07 LAB — BASIC METABOLIC PANEL
Anion gap: 7 (ref 5–15)
BUN: 23 mg/dL — ABNORMAL HIGH (ref 6–20)
CO2: 28 mmol/L (ref 22–32)
Calcium: 9.6 mg/dL (ref 8.9–10.3)
Chloride: 105 mmol/L (ref 101–111)
Creatinine, Ser: 1.22 mg/dL (ref 0.61–1.24)
GFR calc Af Amer: >60 mL/min (ref >60)
GFR calc non Af Amer: >60 mL/min (ref >60)
Glucose, Bld: 146 mg/dL — ABNORMAL HIGH (ref 65–99)
Potassium: 4.8 mmol/L (ref 3.5–5.1)
Sodium: 140 mmol/L (ref 135–145)

## 2016-09-07 LAB — URINALYSIS, ROUTINE W REFLEX MICROSCOPIC
Bilirubin Urine: NEGATIVE
Glucose, UA: 100 mg/dL — AB
Hgb urine dipstick: NEGATIVE
Ketones, ur: NEGATIVE mg/dL
Nitrite: NEGATIVE
Protein, ur: NEGATIVE mg/dL
Specific Gravity, Urine: 1.031 — ABNORMAL HIGH (ref 1.005–1.030)
pH: 7 (ref 5.0–8.0)

## 2016-09-07 LAB — URINALYSIS, MICROSCOPIC (REFLEX)

## 2016-09-07 LAB — CBC
HCT: 47.7 % (ref 39.0–52.0)
Hemoglobin: 15.8 g/dL (ref 13.0–17.0)
MCH: 29.6 pg (ref 26.0–34.0)
MCHC: 33.1 g/dL (ref 30.0–36.0)
MCV: 89.5 fL (ref 78.0–100.0)
Platelets: 197 10*3/uL (ref 150–400)
RBC: 5.33 MIL/uL (ref 4.22–5.81)
RDW: 12.7 % (ref 11.5–15.5)
WBC: 7.6 10*3/uL (ref 4.0–10.5)

## 2016-09-07 LAB — TROPONIN I: Troponin I: <0.03 ng/mL (ref <0.03)

## 2016-09-07 MED ORDER — DEXTROSE 5 % IV SOLN
1.0000 g | Freq: Once | INTRAVENOUS | Status: AC
  Administered 2016-09-07: 1 g via INTRAVENOUS
  Filled 2016-09-07: qty 10

## 2016-09-07 MED ORDER — KETOROLAC TROMETHAMINE 15 MG/ML IJ SOLN
15.0000 mg | Freq: Once | INTRAMUSCULAR | Status: AC
Start: 2016-09-07 — End: 2016-09-07
  Administered 2016-09-07: 15 mg via INTRAVENOUS
  Filled 2016-09-07: qty 1

## 2016-09-07 MED ORDER — CEPHALEXIN 500 MG PO CAPS: 500.0000 mg | ORAL_CAPSULE | Freq: Three times a day (TID) | ORAL | 0 refills | Status: DC

## 2016-09-07 MED ORDER — LORAZEPAM 2 MG/ML IJ SOLN
1.0000 mg | Freq: Once | INTRAMUSCULAR | Status: AC
  Administered 2016-09-07: 1 mg via INTRAVENOUS
  Filled 2016-09-07: qty 1

## 2016-09-07 MED ORDER — SODIUM CHLORIDE 0.9 % IV BOLUS (SEPSIS)
1000.0000 mL | Freq: Once | INTRAVENOUS | Status: AC
  Administered 2016-09-07: 1000 mL via INTRAVENOUS

## 2016-09-07 NOTE — ED Notes (Signed)
Pt on automatic VS.  

## 2016-09-07 NOTE — ED Notes (Signed)
Dr Kohut in room with patient now. 

## 2016-09-07 NOTE — ED Notes (Signed)
ED Provider at bedside. 

## 2016-09-07 NOTE — ED Notes (Signed)
Patient transported to X-ray 

## 2016-09-07 NOTE — ED Provider Notes (Signed)
[facility] Provider Note   CSN: [id number] Arrival date & time: 09/07/16  1446   By signing my name below, I, Nelwyn Salisbury, attest that this documentation has been prepared under the direction and in the presence of Raeford Razor, MD . Electronically Signed: Nelwyn Salisbury, Scribe. 09/07/2016. 3:25 PM.  History   Chief Complaint Chief Complaint  Patient presents with  . Chest Pain  . Neck Pain  . Fatigue   The history is provided by the patient. No language interpreter was used.    HPI Comments:  Jake Lopez is a 41 y.o. male with pmhx of HTN who presents to the Emergency Department complaining of gradual-onset, constant, worsening fatigue beginning 5 days ago. Pt notes that he has tried taking breaks and resting with no relief. He reports associated chest pain, nausea, shoulder pain, SOB, upper back pain, and neck pain. Pt notes that he works at a job where he does a lot of heavy lifting. He denies any fever, lower extremity swelling, dysuria or difficulty urinating. Pt was seen 3 days ago at urgent care and told that his blood pressure was very high.   History reviewed. No pertinent past medical history.  There are no active problems to display for this patient.   History reviewed. No pertinent surgical history.   Home Medications    Prior to Admission medications   Medication Sig Start Date End Date Taking? Authorizing Provider  doxycycline (VIBRAMYCIN) 100 MG capsule Take 1 capsule (100 mg total) by mouth 2 (two) times daily. 07/06/14   Gwyneth Sprout, MD  HYDROcodone-acetaminophen (NORCO/VICODIN) 5-325 MG per tablet Take 1-2 tablets by mouth every 6 (six) hours as needed for moderate pain or severe pain. 07/06/14   Gwyneth Sprout, MD  naproxen (NAPROSYN) 500 MG tablet Take 1 tablet (500 mg total) by mouth 2 (two) times daily. 11/30/13   April Palumbo, MD    Family History No family history on file.  Social History Social History  Substance Use Topics   . Smoking status: Never Smoker  . Smokeless tobacco: Never Used  . Alcohol use No     Allergies   Patient has no known allergies.   Review of Systems Review of Systems  Constitutional: Negative for fever.  Respiratory: Positive for shortness of breath.   Cardiovascular: Positive for chest pain. Negative for leg swelling.  Gastrointestinal: Positive for nausea.  Genitourinary: Negative for difficulty urinating and dysuria.  Musculoskeletal: Positive for back pain, myalgias and neck pain.  All other systems reviewed and are negative.    Physical Exam Updated Vital Signs BP 136/97   Pulse 74   Temp 98 F (36.7 C) (Oral)   Resp 18   Ht 5\' 11"  (1.803 m)   Wt 255 lb (115.7 kg)   SpO2 97%   BMI 35.57 kg/m   Physical Exam  Constitutional: He is oriented to person, place, and time. He appears well-developed and well-nourished.  HENT:  Head: Normocephalic and atraumatic.  Eyes: EOM are normal.  Neck: Normal range of motion.  Cardiovascular: Normal rate, regular rhythm, normal heart sounds and intact distal pulses.   Pulmonary/Chest: Effort normal and breath sounds normal. No respiratory distress.  Abdominal: Soft. He exhibits no distension. There is no tenderness.  Musculoskeletal: Normal range of motion.  Mind tenderness to left lateral neck and LUE. No midline cervical tenderness. Neurovascularly intact.   Neurological: He is alert and oriented to person, place, and time.  Skin: Skin is warm and dry.  Psychiatric: He has a normal mood and affect. Judgment normal.  Nursing note and vitals reviewed.    ED Treatments / Results  DIAGNOSTIC STUDIES:  Oxygen Saturation is 97% on RA, normal by my interpretation.    COORDINATION OF CARE:  3:32 PM Discussed treatment plan with pt at bedside which includes blood work and pt agreed to plan.  Labs (all labs ordered are listed, but only abnormal results are displayed) Labs Reviewed  URINE CULTURE - Abnormal; Notable for  the following:       Result Value   Culture MULTIPLE SPECIES PRESENT, SUGGEST RECOLLECTION (*)    All other components within normal limits  BASIC METABOLIC PANEL - Abnormal; Notable for the following:    Glucose, Bld 146 (*)    BUN 23 (*)    All other components within normal limits  URINALYSIS, ROUTINE W REFLEX MICROSCOPIC - Abnormal; Notable for the following:    APPearance CLOUDY (*)    Specific Gravity, Urine 1.031 (*)    Glucose, UA 100 (*)    Leukocytes, UA TRACE (*)    All other components within normal limits  URINALYSIS, MICROSCOPIC (REFLEX) - Abnormal; Notable for the following:    Bacteria, UA MANY (*)    Squamous Epithelial / LPF 0-5 (*)    All other components within normal limits  CBC  TROPONIN I  TSH    EKG  EKG Interpretation None       Radiology No results found.  Procedures Procedures (including critical care time)  Medications Ordered in ED Medications - No data to display   Initial Impression / Assessment and Plan / ED Course  I have reviewed the triage vital signs and the nursing notes.  Pertinent labs & imaging results that were available during my care of the patient were reviewed by me and considered in my medical decision making (see chart for details).  Clinical Course       Final Clinical Impressions(s) / ED Diagnoses   Final diagnoses:  Generalized weakness  Urinary tract infection without hematuria, site unspecified    New Prescriptions New Prescriptions   No medications on file   I personally preformed the services scribed in my presence. The recorded information has been reviewed is accurate. Raeford RazorStephen Vertis Bauder, MD.     Raeford RazorStephen Tyishia Aune, MD 09/20/16 0930

## 2016-09-07 NOTE — ED Notes (Signed)
Pt [facility] because he had several different things going on. Monday he was just "tired" while at work. Then he felt he may be having a recurrence of epididymitis. They told him his BP was up. He was having left side CP radiating to his left shoulder, upper back, body felt weak and achy. Told him to come here. Thursday and Friday he was "okay". This a.m. Called wife and told her that he was having chest, shoulder and neck pain and felt weak. Diarrhea x 3 in the past 3 hours.

## 2016-09-07 NOTE — ED Notes (Signed)
Pt states he is unable to provide urine sample at this time.  IV NS infusing without complication.  Pt advised to tell staff when he is able to urinate.  Wife at bedside.

## 2016-09-07 NOTE — ED Triage Notes (Signed)
Pt c/o fatigue, neck pain with left chest pain. Chest pain does not radiate per patient and cannot be reproduced. Reports this has been going on x1 week.

## 2016-09-08 LAB — TSH: TSH: 0.556 u[IU]/mL (ref 0.350–4.500)

## 2016-09-09 LAB — URINE CULTURE

## 2019-05-06 ENCOUNTER — Other Ambulatory Visit: Payer: Self-pay

## 2019-05-06 DIAGNOSIS — Z20822 Contact with and (suspected) exposure to covid-19: Secondary | ICD-10-CM

## 2019-05-07 ENCOUNTER — Telehealth: Payer: Self-pay | Admitting: General Practice

## 2019-05-07 LAB — NOVEL CORONAVIRUS, NAA: SARS-CoV-2, NAA: NOT DETECTED

## 2019-05-07 NOTE — Telephone Encounter (Signed)
Negative COVID results given. Patient results "NOT Detected." Caller expressed understanding. ° °

## 2019-05-17 ENCOUNTER — Other Ambulatory Visit: Payer: Self-pay | Admitting: *Deleted

## 2019-05-17 ENCOUNTER — Other Ambulatory Visit: Payer: Self-pay

## 2019-05-17 VITALS — Temp 97.3°F

## 2019-05-17 DIAGNOSIS — Z125 Encounter for screening for malignant neoplasm of prostate: Secondary | ICD-10-CM

## 2019-05-17 NOTE — Progress Notes (Signed)
Patient: Jake Lopez           Date of Birth: 07/29/1976           MRN: [id number] Visit Date: 05/17/2019 PCP: No primary care provider on file.  Prostate Cancer Screening Date of last physical exam: (June 2020) Date of last rectal exam: (never) Have you ever had any of the following?: None Have you ever had or been told you have an allergy to latex products?: No Are you currently taking any natural prostate preparations?: No Are you currently experiencing any urinary symptoms?: Yes If yes, please explain:: (urinary frequency)  Prostate Exam Exam not completed.  Patient's History There are no active problems to display for this patient.  No past medical history on file.  No family history on file.  Social History   Occupational History  . Not on file  Tobacco Use  . Smoking status: Never Smoker  . Smokeless tobacco: Never Used  Substance and Sexual Activity  . Alcohol use: No  . Drug use: No  . Sexual activity: Not on file

## 2019-05-18 LAB — PSA: Prostate Specific Ag, Serum: 11.3 ng/mL — ABNORMAL HIGH (ref 0.0–4.0)

## 2019-05-24 ENCOUNTER — Telehealth (HOSPITAL_COMMUNITY): Payer: Self-pay | Admitting: *Deleted

## 2019-05-24 NOTE — Telephone Encounter (Signed)
Called patient to discuss PSA results at prostate cancer screening on 05/17/2019. Patients PSA was 11.3. Explained to patient that he needs to follow-up with an Urologist and the importance of follow-up. Patient doesn't have insurance. Told patient that I can refer him to [facility] and he can speak with someone in insurance and billing. Also, told patient about the [facility] at [facility]. Told patient that we have a free follow-up clinic 07/12/19 but Dr. Alinda Money has recommended that he follow-up sooner if possible. Patient agreed to the referral to [facility]. Will send referral. Advised patient that someone from [facility] will call him to schedule appointment. Patient verbalized understanding.

## 2019-05-26 ENCOUNTER — Other Ambulatory Visit: Payer: Self-pay

## 2019-05-26 ENCOUNTER — Emergency Department (HOSPITAL_BASED_OUTPATIENT_CLINIC_OR_DEPARTMENT_OTHER): Payer: Self-pay

## 2019-05-26 ENCOUNTER — Emergency Department (HOSPITAL_BASED_OUTPATIENT_CLINIC_OR_DEPARTMENT_OTHER)
Admission: EM | Admit: 2019-05-26 | Discharge: 2019-05-26 | Disposition: A | Discharge status: Home / Self Care | Payer: Self-pay | Attending: Emergency Medicine | Admitting: Emergency Medicine

## 2019-05-26 ENCOUNTER — Encounter (HOSPITAL_BASED_OUTPATIENT_CLINIC_OR_DEPARTMENT_OTHER): Payer: Self-pay | Admitting: Emergency Medicine

## 2019-05-26 DIAGNOSIS — Z79899 Other long term (current) drug therapy: Secondary | ICD-10-CM | POA: Insufficient documentation

## 2019-05-26 DIAGNOSIS — N451 Epididymitis: Secondary | ICD-10-CM

## 2019-05-26 DIAGNOSIS — I10 Essential (primary) hypertension: Secondary | ICD-10-CM | POA: Insufficient documentation

## 2019-05-26 DIAGNOSIS — R1032 Left lower quadrant pain: Secondary | ICD-10-CM

## 2019-05-26 HISTORY — DX: Essential (primary) hypertension: I10

## 2019-05-26 HISTORY — DX: Elevated prostate specific antigen (PSA): R97.20

## 2019-05-26 HISTORY — DX: Epididymitis: N45.1

## 2019-05-26 LAB — URINALYSIS, ROUTINE W REFLEX MICROSCOPIC
Bilirubin Urine: NEGATIVE
Glucose, UA: NEGATIVE mg/dL
Hgb urine dipstick: NEGATIVE
Ketones, ur: NEGATIVE mg/dL
Leukocytes,Ua: NEGATIVE
Nitrite: NEGATIVE
Protein, ur: NEGATIVE mg/dL
Specific Gravity, Urine: >1.03 — ABNORMAL HIGH (ref 1.005–1.030)
pH: 6 (ref 5.0–8.0)

## 2019-05-26 LAB — OCCULT BLOOD X 1 CARD TO LAB, STOOL: Fecal Occult Bld: NEGATIVE

## 2019-05-26 MED ORDER — LEVOFLOXACIN 500 MG PO TABS: 500.0000 mg | ORAL_TABLET | Freq: Every day | ORAL | 0 refills | Status: AC

## 2019-05-26 MED FILL — levoFLOXacin 500 MG TABS: 500 | 10 days supply | Qty: 10 | Fill #0

## 2019-05-26 NOTE — Discharge Instructions (Signed)
Your history and exam today are consistent with recurrent epididymitis.  Please take the antibiotics for the next 10 days to treat this.  Please follow-up with the urology team to both further monitor the epididymitis improving as well as follow-up your elevated PSA we discussed.  Your rectal bleeding is likely due to internal hemorrhoids with your constipation and you do not have bleeding on the exam.  If any symptoms change or worsen, please return to the nearest emergency department.

## 2019-05-26 NOTE — ED Notes (Signed)
Patient is resting comfortably. 

## 2019-05-26 NOTE — ED Provider Notes (Signed)
[facility] EMERGENCY DEPARTMENT Provider Note   CSN: [id number] Arrival date & time: 05/26/19  5400     History   Chief Complaint Chief Complaint  Patient presents with  . Testicle Pain    HPI Jake Lopez is a 43 y.o. male.     The history is provided by the patient, medical records and a significant other. No language interpreter was used.  Groin Pain This is a recurrent problem. The current episode started more than 1 week ago. The problem occurs constantly. The problem has not changed since onset.Pertinent negatives include no chest pain, no abdominal pain, no headaches and no shortness of breath. Nothing aggravates the symptoms. Nothing relieves the symptoms. He has tried nothing for the symptoms. The treatment provided no relief.    Past Medical History:  Diagnosis Date  . Elevated PSA   . Epididymitis   . Hypertension     There are no active problems to display for this patient.   History reviewed. No pertinent surgical history.      Home Medications    Prior to Admission medications   Medication Sig Start Date End Date Taking? Authorizing Provider  amLODipine (NORVASC) 5 MG tablet TAKE ONE TABLET (5 MG DOSE) BY MOUTH DAILY. 03/23/19   [provider]    Family History No family history on file.  Social History Social History   Tobacco Use  . Smoking status: Never Smoker  . Smokeless tobacco: Never Used  Substance Use Topics  . Alcohol use: No  . Drug use: No     Allergies   Patient has no known allergies.   Review of Systems Review of Systems  Constitutional: Negative for chills, diaphoresis, fatigue and fever.  HENT: Negative for congestion.   Respiratory: Negative for cough, chest tightness, shortness of breath and wheezing.   Cardiovascular: Negative for chest pain, palpitations and leg swelling.  Gastrointestinal: Positive for blood in stool, constipation and vomiting. Negative for abdominal pain, diarrhea, nausea  and rectal pain.  Genitourinary: Positive for frequency (for years). Negative for decreased urine volume, dysuria and flank pain.  Musculoskeletal: Negative for back pain, gait problem, neck pain and neck stiffness.  Skin: Negative for rash and wound.  Neurological: Negative for light-headedness and headaches.  Psychiatric/Behavioral: Negative for agitation and confusion.  All other systems reviewed and are negative.    Physical Exam Updated Vital Signs Temp 98 F (36.7 C) (Oral)   Ht 6' (1.829 m)   Wt 111.1 kg   BMI 33.23 kg/m   Physical Exam Vitals signs and nursing note reviewed. Exam conducted with a chaperone present.  Constitutional:      General: He is not in acute distress.    Appearance: He is well-developed. He is not ill-appearing, toxic-appearing or diaphoretic.  HENT:     Head: Normocephalic and atraumatic.     Nose: No congestion or rhinorrhea.  Eyes:     Conjunctiva/sclera: Conjunctivae normal.     Pupils: Pupils are equal, round, and reactive to light.  Neck:     Musculoskeletal: Neck supple. No muscular tenderness.  Cardiovascular:     Rate and Rhythm: Normal rate and regular rhythm.     Pulses: Normal pulses.     Heart sounds: No murmur.  Pulmonary:     Effort: Pulmonary effort is normal. No respiratory distress.     Breath sounds: Normal breath sounds.  Abdominal:     General: Abdomen is flat. There is no distension.  Palpations: Abdomen is soft.     Tenderness: There is no abdominal tenderness. There is no right CVA tenderness, left CVA tenderness, guarding or rebound.     Hernia: There is no hernia in the left inguinal area or right inguinal area.  Genitourinary:    Penis: Normal and circumcised.      Scrotum/Testes: Cremasteric reflex is present.        Right: Tenderness not present. Cremasteric reflex is present.         Left: Tenderness present. Cremasteric reflex is present.      Epididymis:     Right: No tenderness.     Left: Tenderness  present.     Prostate: Not tender.     Rectum: Internal hemorrhoid present. No tenderness or external hemorrhoid. Normal anal tone.    Musculoskeletal:        General: No tenderness.     Right lower leg: No edema.     Left lower leg: No edema.  Skin:    General: Skin is warm and dry.     Capillary Refill: Capillary refill takes less than 2 seconds.     Findings: No erythema or rash.  Neurological:     Mental Status: He is alert.  Psychiatric:        Mood and Affect: Mood normal.      ED Treatments / Results  Labs (all labs ordered are listed, but only abnormal results are displayed) Labs Reviewed  URINALYSIS, ROUTINE W REFLEX MICROSCOPIC - Abnormal; Notable for the following components:      Result Value   Specific Gravity, Urine >1.030 (*)    All other components within normal limits  OCCULT BLOOD X 1 CARD TO LAB, STOOL    EKG None  Radiology US Scrotum W/doppler  Result Date: 05/26/2019 CLINICAL DATA:  History of epididymitis.  Left testicular pain. EXAM: SCROTAL ULTRASOUND DOPPLER ULTRASOUND OF THE TESTICLES TECHNIQUE: Complete ultrasound examination of the testicles, epididymis, and other scrotal structures was performed. Color and spectral Doppler ultrasound were also utilized to evaluate blood flow to the testicles. COMPARISON:  07/06/2014 FINDINGS: Right testicle Measurements: 4.4 x 2.1 x 2.5 cm. No mass or microlithiasis visualized. Left testicle Measurements: 4.1 x 2.3 x 2.7 cm. No mass or microlithiasis visualized. Right epididymis:  Normal in size and appearance. Left epididymis:  Normal in size and appearance. Hydrocele:  None visualized. Varicocele:  None visualized. Pulsed Doppler interrogation of both testes demonstrates normal low resistance arterial and venous waveforms bilaterally. IMPRESSION: Unremarkable study. No testicular abnormality or evidence of torsion. Electronically Signed   By: Charlett Nose M.D.   On: 05/26/2019 09:07    Procedures Procedures  (including critical care time)  Medications Ordered in ED Medications - No data to display   Initial Impression / Assessment and Plan / ED Course  I have reviewed the triage vital signs and the nursing notes.  Pertinent labs & imaging results that were available during my care of the patient were reviewed by me and considered in my medical decision making (see chart for details).        Jake Lopez is a 43 y.o. male with a past medical history significant for multiple episodes of prior epididymitis and hypertension who presents with left testicle pain.  Patient reports that he has had recurrent left testicle pain for the last week and a half.  He reports he feels similar to prior episodes of epididymitis.  He denies trauma or sudden onset of injury.  He has never had torsion.  He denies any dysuria or hematuria.  He denies any other abdominal pain at this time.  He reports occasional nausea and vomiting but denies fevers, chills, chest pain, palpitations, shortness of breath.  He reports some constipation and reports some occasional rectal bleeding.  He reports he is also concerned because his PCP did a PSA on him and just told him the results several days ago that his PSA is elevated.  He needs to follow-up with urology for further prostate work-up.  Patient is concerned his prostate may be involved with his current symptoms.  He denies any other abdominal pains, cramping, or kidney stones history.  On exam, lungs clear chest is nontender.  Abdomen is completely nontender.  A chaperone exam will be utilized to examine his groin and do a DRE with a fecal occult test.  He does report he has been on Tums for reflux before and thinks this may be contributing to the possibly bloody stools.  Due to the history of epididymitis, will get ultrasound and will get screening urinalysis.  Urinalysis shows no evidence of infection.  No bleeding seen.  7:41 AM On my chaperone groin exam and rectal  exam, patient had left testicle tenderness but had normal cremasteric reflex.  No penile tenderness.  No rashes or erythema seen.  No swelling seen.  No hernias palpated.  No abdominal pain in the lower abdomen.  Rectal exam revealed no prostate tenderness.  I did palpate what felt to be an internal hemorrhoid with no gross blood seen.  Fecal occult test was sent.  Clinically have low suspicion for prostatitis based on exam.  Will await the results of the ultrasound and the fecal occult test.  I suspect patient may have had rectal bleeding and related to his constipation and likely internal hemorrhoid.  Will defer further prostate work-up to the urology team which she will need to follow-up with.  10:21 AM Ultrasound not show torsion or significant mass or infection.  Given the patient's history of multiple episodes of epididymitis, will treat empirically for likely epididymitis.  Urine does not show UTI.  Fecal occult test was negative.  Suspect internal hemorrhoid causing bleeding in the setting of constipation.  Patient will follow-up with his urologist for further management of the elevated PSA as well as his recurrent epididymitis.  Patient agreed with plan of care and was discharged in good condition.   Final Clinical Impressions(s) / ED Diagnoses   Final diagnoses:  Epididymitis, left  Groin pain, left    ED Discharge Orders         Ordered    levofloxacin (LEVAQUIN) 500 MG tablet  Daily     05/26/19 1023         Clinical Impression: 1. Epididymitis, left   2. Groin pain, left     Disposition: Discharge  Condition: Good  I have discussed the results, Dx and Tx plan with the pt(& family if present). He/she/they expressed understanding and agree(s) with the plan. Discharge instructions discussed at great length. Strict return precautions discussed and pt &/or family have verbalized understanding of the instructions. No further questions at time of discharge.    New  Prescriptions   LEVOFLOXACIN (LEVAQUIN) 500 MG TABLET    Take 1 tablet (500 mg total) by mouth daily for 10 days.    Follow Up: [facility] [address] [place] [address] [phone]       Tegeler, Cristal Deer  J, MD 05/26/19 1025

## 2019-05-26 NOTE — ED Triage Notes (Signed)
Pt c/o left testicular pain. Pt has hx of recurrent epididymitis.

## 2019-07-12 ENCOUNTER — Encounter (HOSPITAL_BASED_OUTPATIENT_CLINIC_OR_DEPARTMENT_OTHER): Payer: Self-pay | Admitting: *Deleted

## 2019-07-12 ENCOUNTER — Other Ambulatory Visit: Payer: Self-pay

## 2019-07-12 ENCOUNTER — Emergency Department (HOSPITAL_BASED_OUTPATIENT_CLINIC_OR_DEPARTMENT_OTHER): Payer: No Typology Code available for payment source

## 2019-07-12 ENCOUNTER — Emergency Department (HOSPITAL_BASED_OUTPATIENT_CLINIC_OR_DEPARTMENT_OTHER)
Admission: EM | Admit: 2019-07-12 | Discharge: 2019-07-12 | Disposition: A | Discharge status: Home / Self Care | Payer: No Typology Code available for payment source | Attending: Emergency Medicine | Admitting: Emergency Medicine

## 2019-07-12 DIAGNOSIS — R1032 Left lower quadrant pain: Secondary | ICD-10-CM | POA: Diagnosis not present

## 2019-07-12 DIAGNOSIS — I10 Essential (primary) hypertension: Secondary | ICD-10-CM | POA: Insufficient documentation

## 2019-07-12 DIAGNOSIS — Z79899 Other long term (current) drug therapy: Secondary | ICD-10-CM | POA: Insufficient documentation

## 2019-07-12 DIAGNOSIS — N50812 Left testicular pain: Secondary | ICD-10-CM | POA: Insufficient documentation

## 2019-07-12 LAB — CBC WITH DIFFERENTIAL/PLATELET
Abs Immature Granulocytes: 0.01 10*3/uL (ref 0.00–0.07)
Basophils Absolute: 0 10*3/uL (ref 0.0–0.1)
Basophils Relative: 1 %
Eosinophils Absolute: 0.1 10*3/uL (ref 0.0–0.5)
Eosinophils Relative: 2 %
HCT: 45.4 % (ref 39.0–52.0)
Hemoglobin: 14.6 g/dL (ref 13.0–17.0)
Immature Granulocytes: 0 %
Lymphocytes Relative: 31 %
Lymphs Abs: 1.7 10*3/uL (ref 0.7–4.0)
MCH: 29.7 pg (ref 26.0–34.0)
MCHC: 32.2 g/dL (ref 30.0–36.0)
MCV: 92.3 fL (ref 80.0–100.0)
Monocytes Absolute: 0.6 10*3/uL (ref 0.1–1.0)
Monocytes Relative: 10 %
Neutro Abs: 3.2 10*3/uL (ref 1.7–7.7)
Neutrophils Relative %: 56 %
Platelets: 207 10*3/uL (ref 150–400)
RBC: 4.92 MIL/uL (ref 4.22–5.81)
RDW: 12.4 % (ref 11.5–15.5)
WBC: 5.6 10*3/uL (ref 4.0–10.5)
nRBC: 0 % (ref 0.0–0.2)

## 2019-07-12 LAB — COMPREHENSIVE METABOLIC PANEL
ALT: 22 U/L (ref 0–44)
AST: 18 U/L (ref 15–41)
Albumin: 4 g/dL (ref 3.5–5.0)
Alkaline Phosphatase: 32 U/L — ABNORMAL LOW (ref 38–126)
Anion gap: 7 (ref 5–15)
BUN: 20 mg/dL (ref 6–20)
CO2: 27 mmol/L (ref 22–32)
Calcium: 9.2 mg/dL (ref 8.9–10.3)
Chloride: 104 mmol/L (ref 98–111)
Creatinine, Ser: 1.81 mg/dL — ABNORMAL HIGH (ref 0.61–1.24)
GFR calc Af Amer: 52 mL/min — ABNORMAL LOW (ref >60)
GFR calc non Af Amer: 44 mL/min — ABNORMAL LOW (ref >60)
Glucose, Bld: 99 mg/dL (ref 70–99)
Potassium: 4.5 mmol/L (ref 3.5–5.1)
Sodium: 138 mmol/L (ref 135–145)
Total Bilirubin: 0.7 mg/dL (ref 0.3–1.2)
Total Protein: 7.4 g/dL (ref 6.5–8.1)

## 2019-07-12 LAB — URINALYSIS, ROUTINE W REFLEX MICROSCOPIC
Bilirubin Urine: NEGATIVE
Glucose, UA: NEGATIVE mg/dL
Hgb urine dipstick: NEGATIVE
Ketones, ur: NEGATIVE mg/dL
Leukocytes,Ua: NEGATIVE
Nitrite: NEGATIVE
Protein, ur: NEGATIVE mg/dL
Specific Gravity, Urine: 1.02 (ref 1.005–1.030)
pH: 7 (ref 5.0–8.0)

## 2019-07-12 LAB — LIPASE, BLOOD: Lipase: 37 U/L (ref 11–51)

## 2019-07-12 MED ORDER — IOHEXOL 300 MG/ML  SOLN
100.0000 mL | Freq: Once | INTRAMUSCULAR | Status: AC | PRN
  Administered 2019-07-12: 22:00:00 100 mL via INTRAVENOUS

## 2019-07-12 NOTE — Discharge Instructions (Addendum)
You have been seen today for abdominal and testicular pain. Please read and follow all provided instructions. Return to the emergency room for worsening condition or new concerning symptoms.    Your CT scan today did not show any signs of diverticulitis.  It did show you have an umbilical fat-containing hernia. -Your lab work today shows your kidney function is elevated.  Your creatinine today was 1.81.  I recommend you have your primary care doctor recheck this within 1 week.  It is important in that time to increase your water intake to help improve your kidney function.  1. Medications:  -Continue usual home medications -Do not take anti-inflammatory medications such as ibuprofen, Motrin, Advil, Aleve, Goody powder.  These and worsen your kidney function even more. -Recommend for pain you take Tylenol.  You should take this as prescribed on the bottle. Take medications as prescribed. Please review all of the medicines and only take them if you do not have an allergy to them.   2. Treatment: rest, drink plenty of fluids  3. Follow Up: Please follow up with your primary doctor in 2-5 days for discussion of your diagnoses and further evaluation after today's visit; Call today to arrange your follow up.  -Also follow up with your urologist as discussed and planned for biopsy.  It is also a possibility that you have an allergic reaction to any of the medicines that you have been prescribed - Everybody reacts differently to medications and while MOST people have no trouble with most medicines, you may have a reaction such as nausea, vomiting, rash, swelling, shortness of breath. If this is the case, please stop taking the medicine immediately and contact your physician.  ?

## 2019-07-12 NOTE — ED Triage Notes (Addendum)
Testicular pain and lower abdominal pain for months. He has had the symptoms evaluated here in the past.

## 2019-07-12 NOTE — ED Notes (Signed)
Pt states PSA level was elevated at 11. Has left abd pain, left testicular pain that radiates down to left foot. Intermittent n/v/d. Last emesis was a small amount last night. NAD. A&Ox4. 6/10 pain.

## 2019-07-12 NOTE — ED Provider Notes (Signed)
[facility] EMERGENCY DEPARTMENT Provider Note   CSN: [id number] Arrival date & time: 07/12/19  1941     History   Chief Complaint Chief Complaint  Patient presents with   Testicle Pain   Abdominal Pain    HPI Jake Lopez is a 43 y.o. male with past medical history significant for elevated PSA, epididymitis, hypertension presents to emergency department today with chief complaint of abdominal pain and testicular pain.  Patient states his pain has been present since September, approximately 2 months.  His abdominal pain is located in his left lower abdomen.  He states his pain is intermittent.  He describes it as dull cramping sensation.  He rates the pain 6 out of 10 in severity.  He is also reporting intermittent nausea vomiting diarrhea.  His last episode was emesis was yesterday.  It was nonbloody nonbilious.  He had diarrhea 3 days ago.  He denies any blood in his stool.  He has had a normal bowel movement since then.  His last bowel movement was this morning.  He denies abdominal surgical history.  Patient describes his testicular pain is located in his left testicle.  This is a recurrent problem.  This current episode of pain started 10 days ago.  He says the pain radiates down to his left foot.  He describes the pain as sharp.  Pain is constant.  He has taken Aleve without symptom relief.  He admits to urinary frequency which he has had for years.  It is unchanged today. Also denies fever, chills, gross hematuria, dysuria, penile discharge, scrotal swelling, flank pain.  Patient states he has an appointment scheduled on 08/17/2019 for a prostate biopsy with [facility].  History provided by patient with additional history obtained from chart review.     Past Medical History:  Diagnosis Date   Elevated PSA    Epididymitis    Hypertension     There are no active problems to display for this patient.   History reviewed. No pertinent surgical  history.      Home Medications    Prior to Admission medications   Medication Sig Start Date End Date Taking? Authorizing Provider  amLODipine (NORVASC) 5 MG tablet Take by mouth. 07/08/19  Yes [provider]  omeprazole (PRILOSEC) 40 MG capsule Take by mouth. 07/08/19  Yes [provider]  amLODipine (NORVASC) 5 MG tablet TAKE ONE TABLET (5 MG DOSE) BY MOUTH DAILY. 03/23/19   [provider]    Family History No family history on file.  Social History Social History   Tobacco Use   Smoking status: Never Smoker   Smokeless tobacco: Never Used  Substance Use Topics   Alcohol use: No   Drug use: No     Allergies   Patient has no known allergies.   Review of Systems Review of Systems  Constitutional: Negative for chills and fever.  HENT: Negative for congestion, rhinorrhea, sinus pressure and sore throat.   Eyes: Negative for pain and redness.  Respiratory: Negative for cough, shortness of breath and wheezing.   Cardiovascular: Negative for chest pain and palpitations.  Gastrointestinal: Positive for abdominal pain. Negative for constipation, diarrhea, nausea and vomiting.  Genitourinary: Positive for frequency (chronic, unchanged today) and testicular pain. Negative for discharge, dysuria, flank pain, hematuria, penile pain, penile swelling, scrotal swelling and urgency.  Musculoskeletal: Negative for arthralgias, back pain, myalgias and neck pain.  Skin: Negative for rash and wound.  Neurological: Negative for dizziness, syncope, weakness,  numbness and headaches.  Psychiatric/Behavioral: Negative for confusion.     Physical Exam Updated Vital Signs BP (!) 129/104 (BP Location: Right Arm)    Pulse 79    Temp 98.6 F (37 C) (Oral)    Resp 17    Ht 6' (1.829 m)    Wt 113.4 kg    SpO2 98%    BMI 33.91 kg/m   Physical Exam Vitals signs and nursing note reviewed.  Constitutional:      General: He is not in acute distress.     Appearance: He is not ill-appearing.  HENT:     Head: Normocephalic and atraumatic.     Right Ear: Tympanic membrane and external ear normal.     Left Ear: Tympanic membrane and external ear normal.     Nose: Nose normal.     Mouth/Throat:     Mouth: Mucous membranes are moist.     Pharynx: Oropharynx is clear.  Eyes:     General: No scleral icterus.       Right eye: No discharge.        Left eye: No discharge.     Extraocular Movements: Extraocular movements intact.     Conjunctiva/sclera: Conjunctivae normal.     Pupils: Pupils are equal, round, and reactive to light.  Neck:     Musculoskeletal: Normal range of motion.     Vascular: No JVD.  Cardiovascular:     Rate and Rhythm: Normal rate and regular rhythm.     Pulses: Normal pulses.          Radial pulses are 2+ on the right side and 2+ on the left side.     Heart sounds: Normal heart sounds.  Pulmonary:     Comments: Lungs clear to auscultation in all fields. Symmetric chest rise. No wheezing, rales, or rhonchi. Abdominal:     Tenderness: There is no right CVA tenderness or left CVA tenderness.     Comments: Abdomen is soft, non-distended.  Mildly tender to palpation in left lower quadrant. No rigidity, no guarding. No peritoneal signs.  Genitourinary:    Comments: Chaperone Dr. Pilar PlateBero present for exam. No discharge or urethritis noted. No signs of sores or lesions or erythema on the penis or testicles. The penis and testicles are nontender. No testicular masses or swelling. No scrotal swelling. No signs of any inguinal hernias. Cremaster reflex present bilaterally.   Musculoskeletal: Normal range of motion.  Skin:    General: Skin is warm and dry.     Capillary Refill: Capillary refill takes less than 2 seconds.  Neurological:     Mental Status: He is oriented to person, place, and time.     GCS: GCS eye subscore is 4. GCS verbal subscore is 5. GCS motor subscore is 6.     Comments: Fluent speech, no facial droop.   Psychiatric:        Behavior: Behavior normal.      ED Treatments / Results  Labs (all labs ordered are listed, but only abnormal results are displayed) Labs Reviewed  COMPREHENSIVE METABOLIC PANEL - Abnormal; Notable for the following components:      Result Value   Creatinine, Ser 1.81 (*)    Alkaline Phosphatase 32 (*)    GFR calc non Af Amer 44 (*)    GFR calc Af Amer 52 (*)    All other components within normal limits  CBC WITH DIFFERENTIAL/PLATELET  URINALYSIS, ROUTINE W REFLEX MICROSCOPIC  LIPASE, BLOOD  EKG None  Radiology Ct Abdomen Pelvis W Contrast  Result Date: 07/12/2019 CLINICAL DATA:  Abdominal pain. Left-sided abdominal pain. Left testicular pain. Nausea, vomiting, diarrhea. Elevated PSA. Concern for diverticulitis. EXAM: CT ABDOMEN AND PELVIS WITH CONTRAST TECHNIQUE: Multidetector CT imaging of the abdomen and pelvis was performed using the standard protocol following bolus administration of intravenous contrast. CONTRAST:  OMNIPAQUE IOHEXOL 300 MG/ML  SOLN COMPARISON:  Noncontrast CT 02/22/2013 FINDINGS: Lower chest: Linear atelectasis in both lower lobes. No pleural fluid or confluent airspace disease. Hepatobiliary: No focal liver abnormality is seen. No gallstones, gallbladder wall thickening, or biliary dilatation. Pancreas: No ductal dilatation or inflammation. Spleen: Normal in size without focal abnormality. Small splenule anteriorly. Adrenals/Urinary Tract: Normal adrenal glands. No hydronephrosis or perinephric edema. Homogeneous renal enhancement with symmetric excretion on delayed phase imaging. Lobulated renal contours. Urinary bladder is physiologically distended without wall thickening. Stomach/Bowel: Ingested material within the stomach. No small bowel wall thickening, inflammatory change, or obstruction. Normal appendix. Small volume of stool throughout the colon. No colonic wall thickening. No colonic inflammation. No significant diverticular  disease. Vascular/Lymphatic: Mild aorta bi-iliac atherosclerosis. No aneurysm. Patent portal vein. Misty mesentery with small central mesenteric lymph nodes, chronic and unchanged from prior exam. No enlarged lymph nodes in the abdomen or pelvis. Reproductive: Prostate is unremarkable. Other: Small fat containing umbilical hernia. No free air, free fluid, or intra-abdominal fluid collection. Musculoskeletal: Stable vertebral body hemangioma within L5. There are no acute or suspicious osseous abnormalities. IMPRESSION: 1. No acute abnormality or explanation for abdominal pain. 2. Small fat containing umbilical hernia. Aortic Atherosclerosis (ICD10-I70.0). Electronically Signed   By: Narda Rutherford M.D.   On: 07/12/2019 22:20    Procedures Procedures (including critical care time)  Medications Ordered in ED Medications  iohexol (OMNIPAQUE) 300 MG/ML solution 100 mL (100 mLs Intravenous Contrast Given 07/12/19 2155)     Initial Impression / Assessment and Plan / ED Course  I have reviewed the triage vital signs and the nursing notes.  Pertinent labs & imaging results that were available during my care of the patient were reviewed by me and considered in my medical decision making (see chart for details).  Patient is well-appearing, in no acute distress. He is afebrile.  Lungs are clear to auscultation all fields.  He has mild tenderness to palpation of left lower quadrant, no rigidity or guarding, no peritoneal signs.  No CVA tenderness. Testicular exam performed with chaperone. He had no testicular tenderness, normal cremasteric reflex. No penile tenderness, no overlying erythema. He has no history of testicular torsion and exam today is not consistent with torsion.  Chart review shows patient had a testicular ultrasound at last ED visit in September on 05/26/2019.  That [place] did not show any signs of torsion or significant mass or infection.  Patient was treated empirically for possible  epididymitis.  He states after taking Levaquin his symptoms did not improve.    Lab work today has no leukocytosis, no anemia, no severe electrolyte derangement.  He does have elevated creatinine at 1.81.  Looking through care everywhere patient had labs on 06/11/2019 that showed a creatinine of 1.28.  UA without signs of infection.  Chart review shows patient also had negative STD testing in March 2020.  CT abdomen pelvis today without diverticulitis.  No acute abnormality seen.  Radiologist does comment on a umbilical fat-containing hernia. Given reassuring exam and work up today pt is safe to be discharged home with symptomatic treatment. He already has urology  appointment scheduled for 08/17/2019 that I recommend he keep as scheduled for prostate biopsy and [place] or  try to schedule it sooner if possible.  The patient appears reasonably screened and/or stabilized for discharge and I doubt any other medical condition or other [facility] requiring further screening, evaluation, or treatment in the ED at this time prior to discharge. The patient is safe for discharge with strict return precautions discussed. Recommend pcp follow up to have [place] rechecked within 1 week.  Also recommend he closely monitor his blood pressure and have it rechecked by PCP as well. The patient was discussed with and seen by Dr. Pilar Plate who agrees with the treatment plan.    Portions of this note were generated with Scientist, clinical (histocompatibility and immunogenetics). Dictation errors may occur despite best attempts at proofreading.   Final Clinical Impressions(s) / ED Diagnoses   Final diagnoses:  Left lower quadrant abdominal pain  Testicular pain, left    ED Discharge Orders    None       Sherene Sires, PA-C 07/12/19 2321    Sabas Sous, MD 07/14/19 [phone]

## 2019-12-19 IMAGING — US US SCROTUM W/ DOPPLER COMPLETE
1 series · 14 of 25 positions shown · non-contrast
Comparison: 07/06/2014

CLINICAL DATA: History of epididymitis.  Left testicular pain.

EXAM:
SCROTAL ULTRASOUND
DOPPLER ULTRASOUND OF THE TESTICLES
TECHNIQUE: Complete ultrasound examination of the testicles, epididymis, and
other scrotal structures was performed. Color and spectral Doppler
ultrasound were also utilized to evaluate blood flow to the
testicles.

[Series 1: us scrotum w/ doppler complete · 14 of 71 slices shown]
[im 1/71]
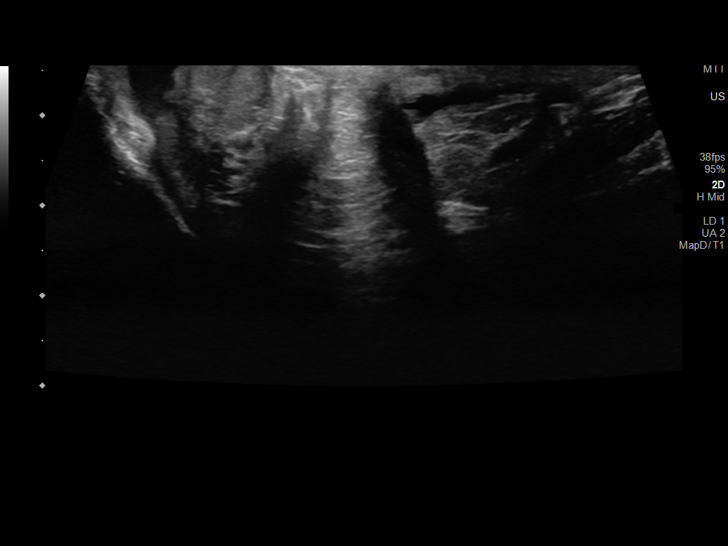
[im 6/71]
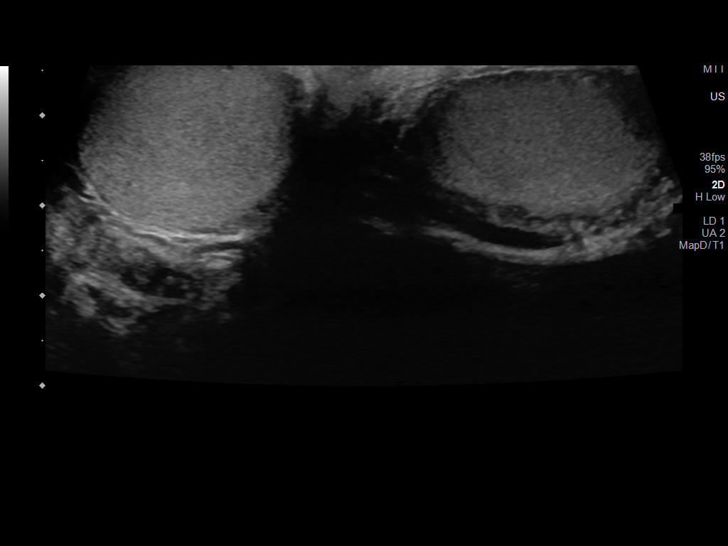
[im 12/71]
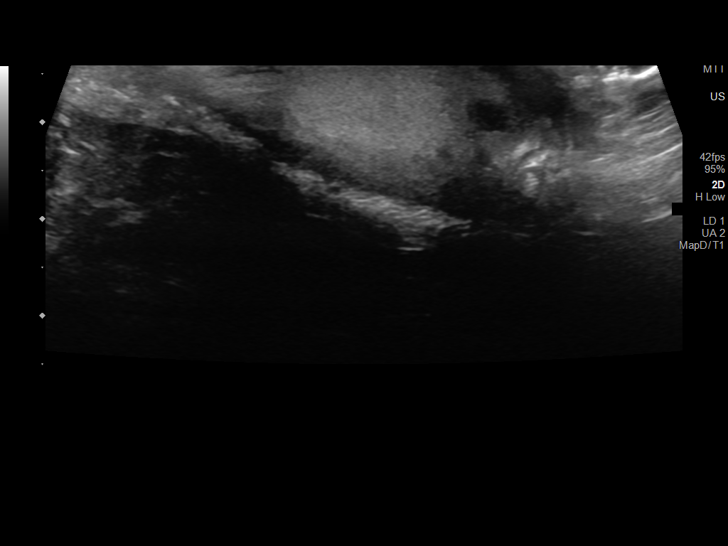
[im 18/71]
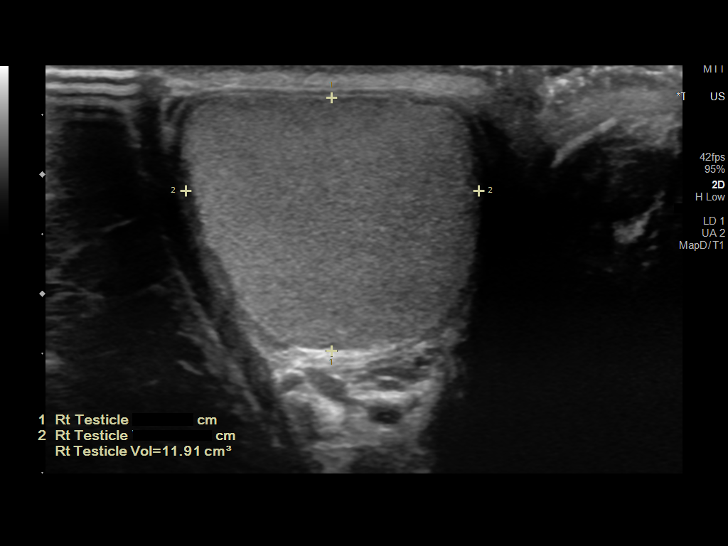
[im 24/71]
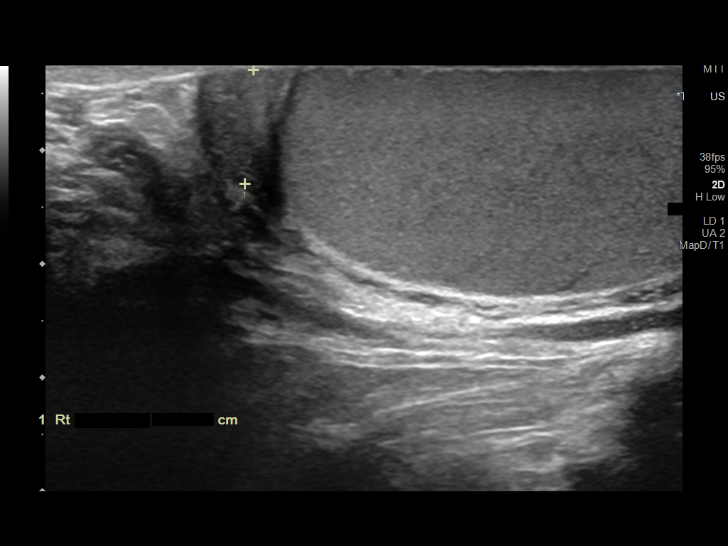
[im 27/71]
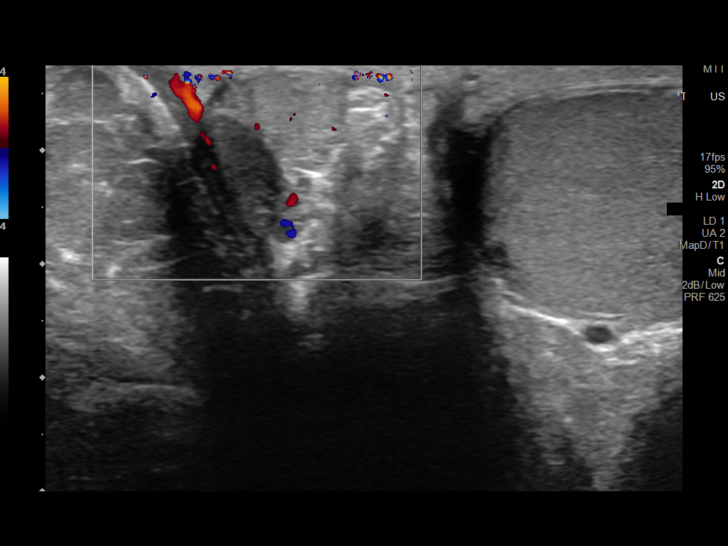
[im 33/71]
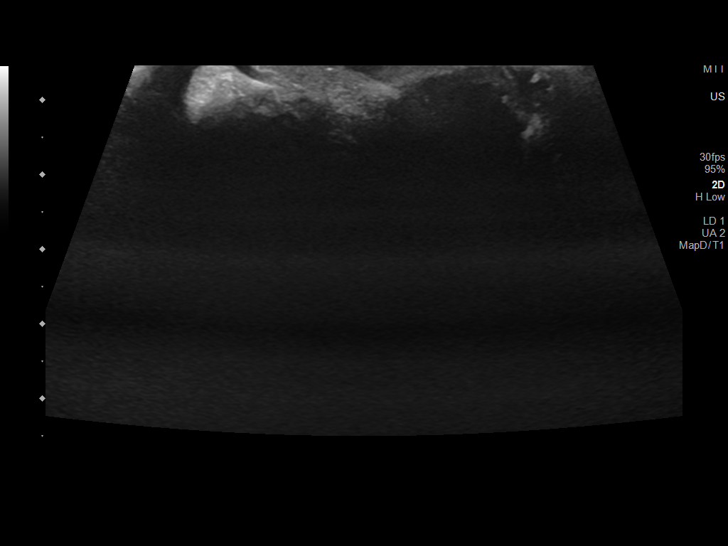
[im 38/71]
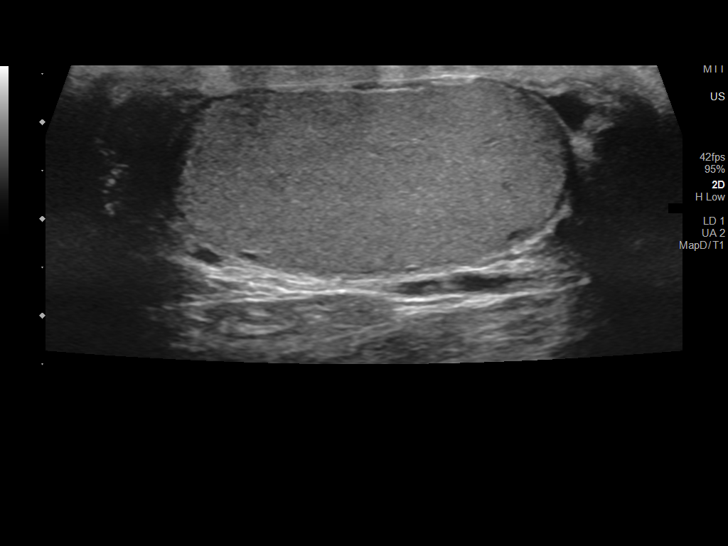
[im 44/71]
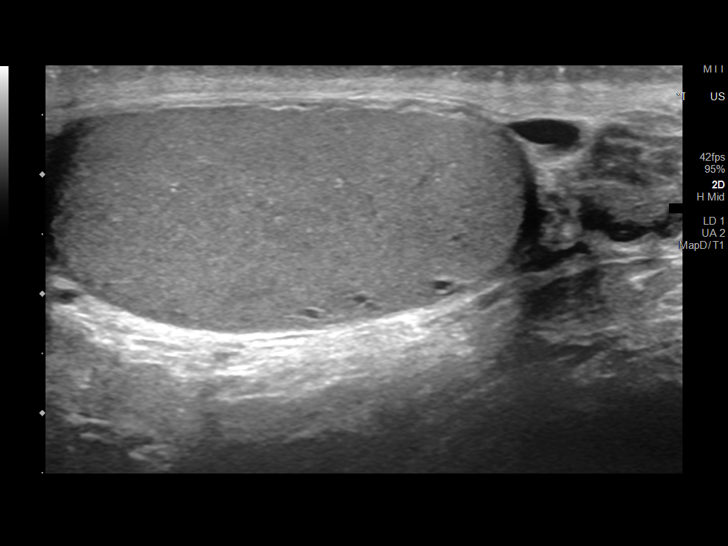
[im 47/71]
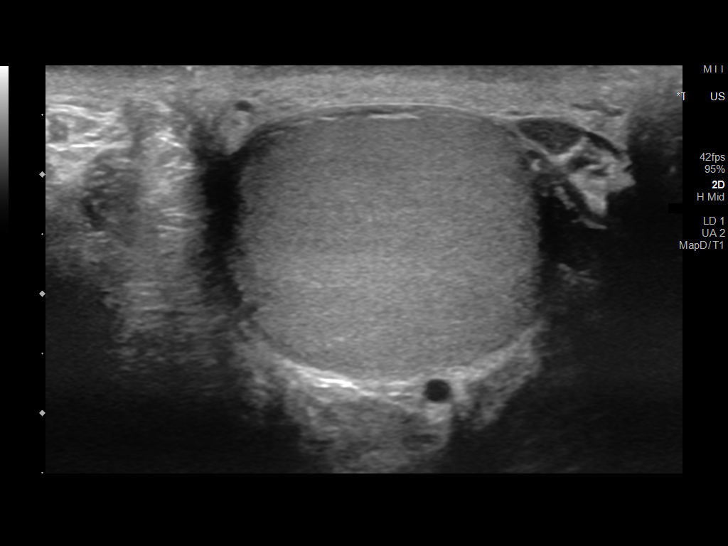
[im 53/71]
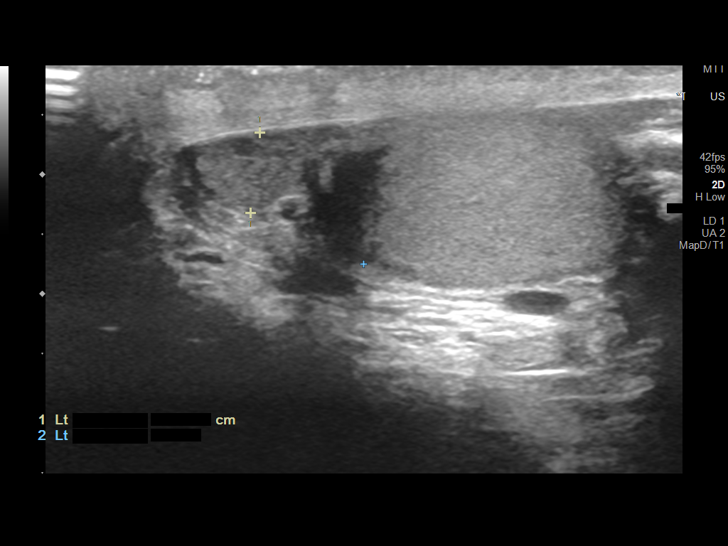
[im 59/71]
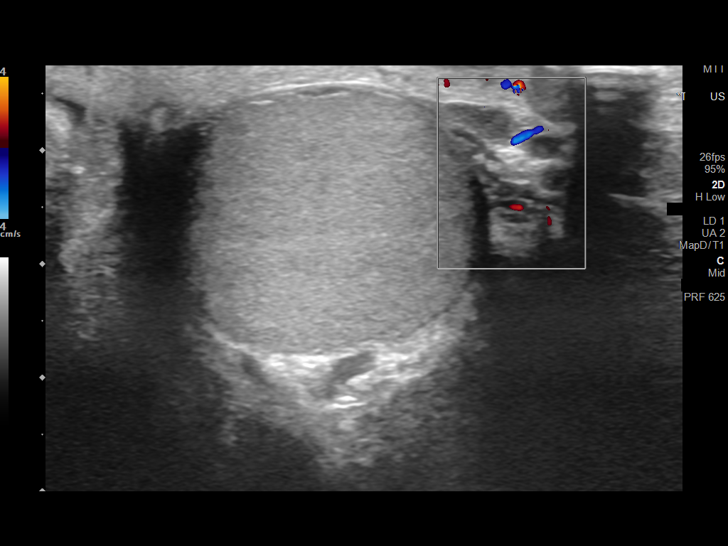
[im 65/71]
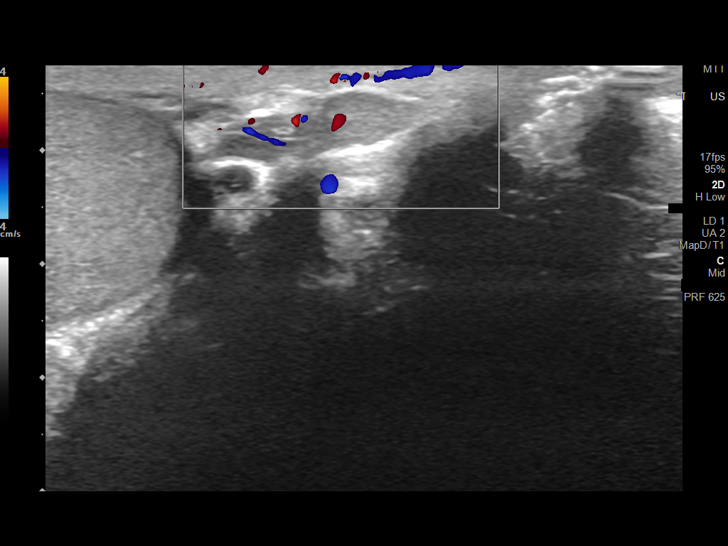
[im 71/71]
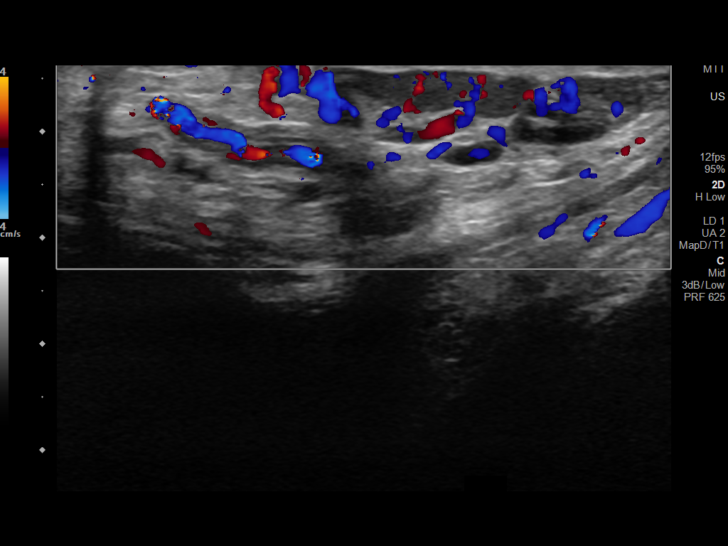

[14 of 25 positions shown; findings below may reference images not displayed]

FINDINGS: Right testicle

Measurements: 4.4 x 2.1 x 2.5 cm. No mass or microlithiasis
visualized.

Left testicle

Measurements: 4.1 x 2.3 x 2.7 cm. No mass or microlithiasis
visualized.

Right epididymis:  Normal in size and appearance.

Left epididymis:  Normal in size and appearance.

Hydrocele:  None visualized.

Varicocele:  None visualized.

Pulsed Doppler interrogation of both testes demonstrates normal low
resistance arterial and venous waveforms bilaterally.
IMPRESSION: Unremarkable study. No testicular abnormality or evidence of
torsion.

## 2020-02-04 IMAGING — CT CT ABD-PELV W/ CM
2 of 5 series · 15 of 46 positions shown, 17 images · IV contrast (Omnipaque)
Comparison: Noncontrast CT 02/22/2013

CLINICAL DATA: Abdominal pain. Left-sided abdominal pain. Left
testicular pain. Nausea, vomiting, diarrhea. Elevated PSA. Concern
for diverticulitis.

EXAM:
CT ABDOMEN AND PELVIS WITH CONTRAST
TECHNIQUE: Multidetector CT imaging of the abdomen and pelvis was performed
using the standard protocol following bolus administration of
intravenous contrast.
CONTRAST:  100mL OMNIPAQUE IOHEXOL 300 MG/ML  SOLN

[Series 2: axial st · axial · 0.98mm/px · z∈[+834,+1324]mm · 12 of 112 slices shown, 14 images]
[im 7/112  soft-tissue]
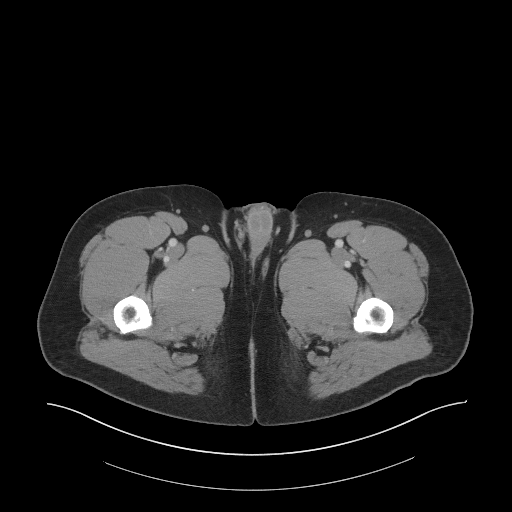
[im 7/112  bone]
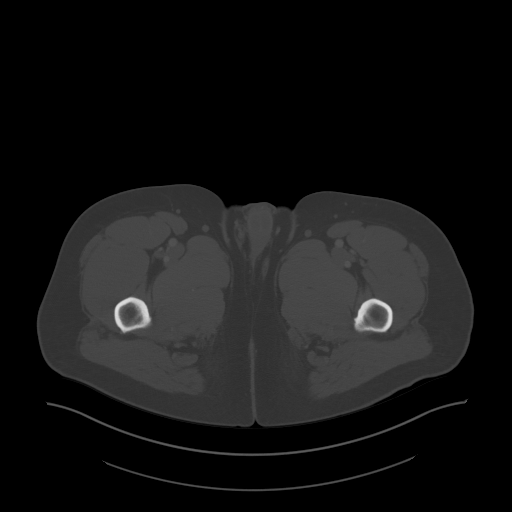
[im 19/112  soft-tissue]
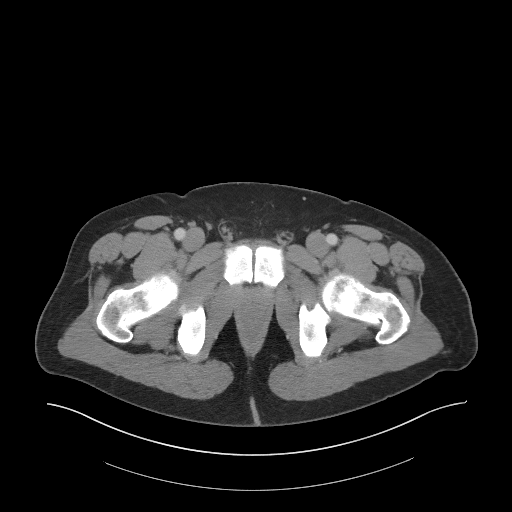
[im 25/112  soft-tissue]
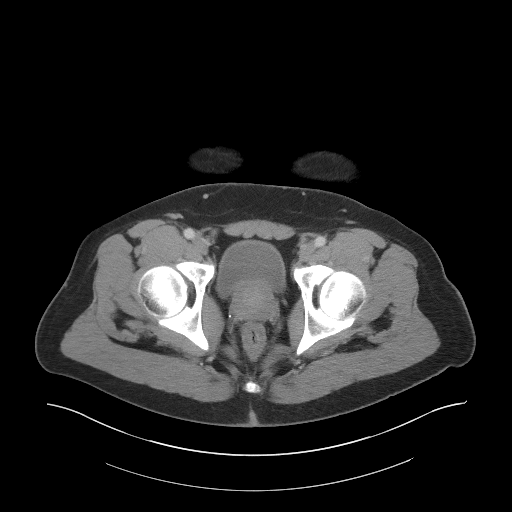
[im 31/112  soft-tissue]
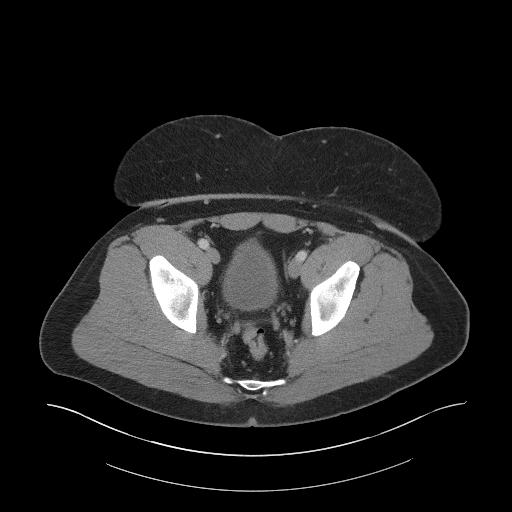
[im 44/112  soft-tissue]
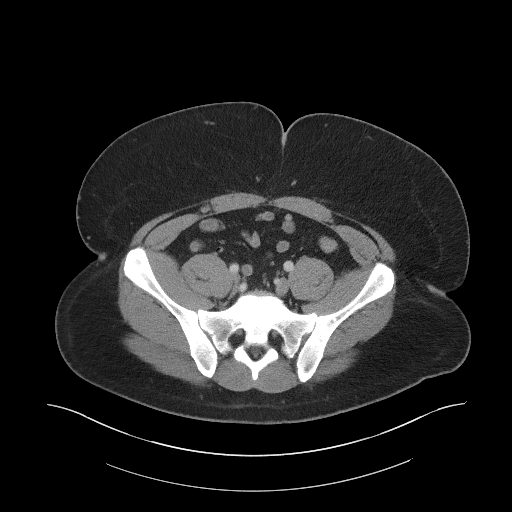
[im 50/112  soft-tissue]
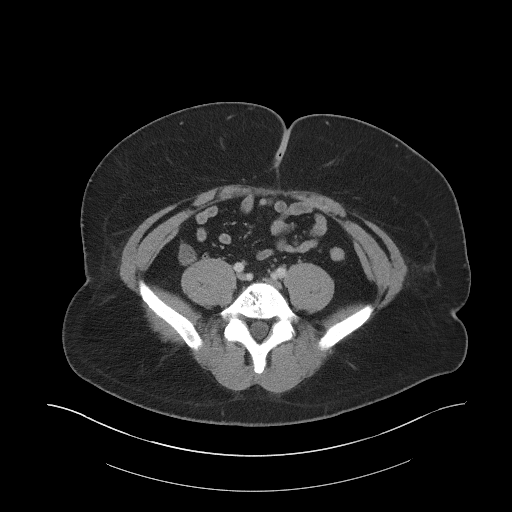
[im 62/112  soft-tissue]
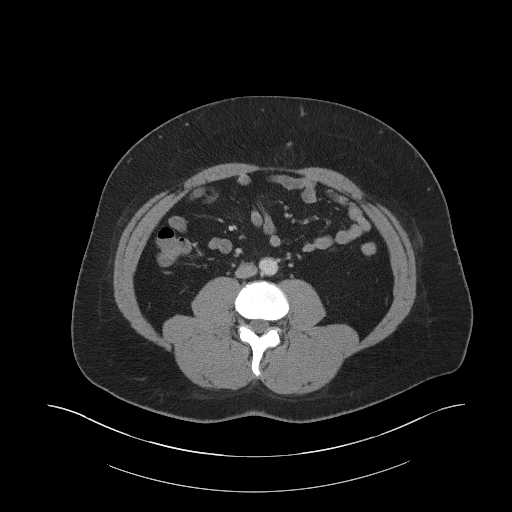
[im 68/112  soft-tissue]
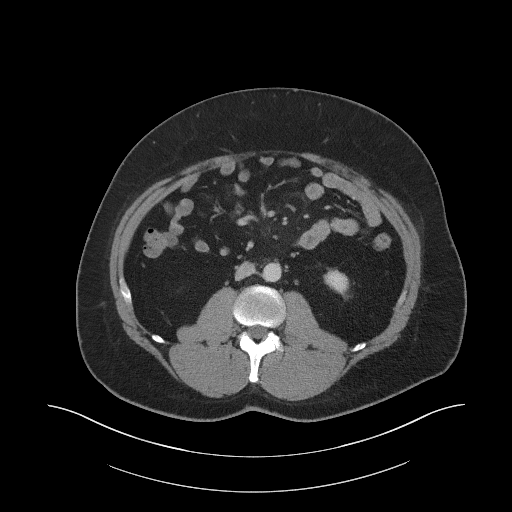
[im 81/112  soft-tissue]
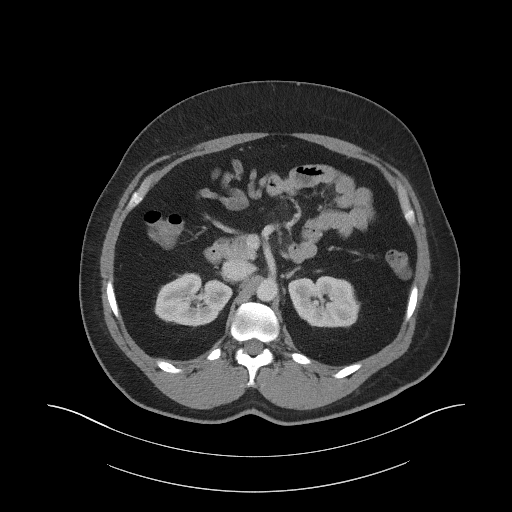
[im 81/112  bone]
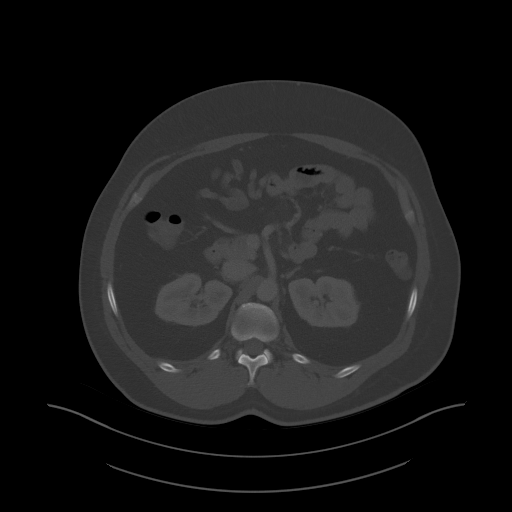
[im 87/112  soft-tissue]
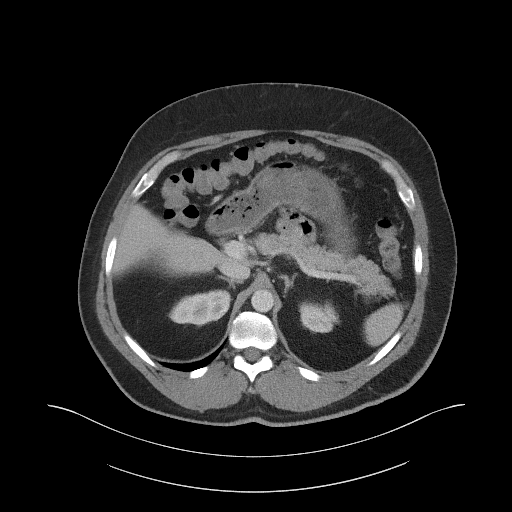
[im 93/112  soft-tissue]
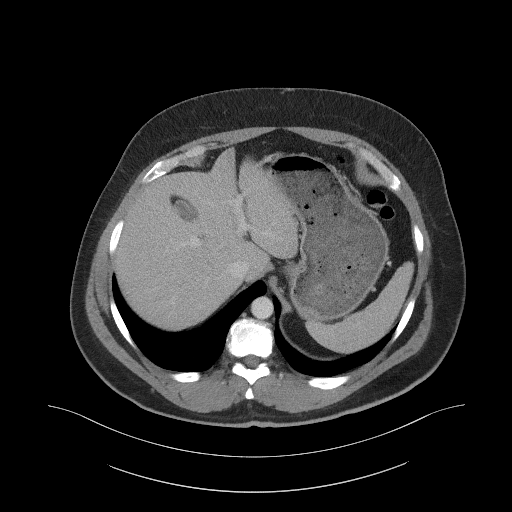
[im 105/112  soft-tissue]
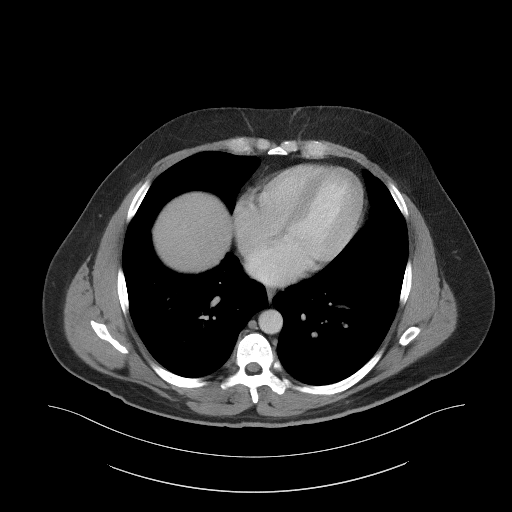

[Series 5: coronal st · coronal · 0.83mm/px · 3 of 111 slices shown]
[im 37/111  soft-tissue]
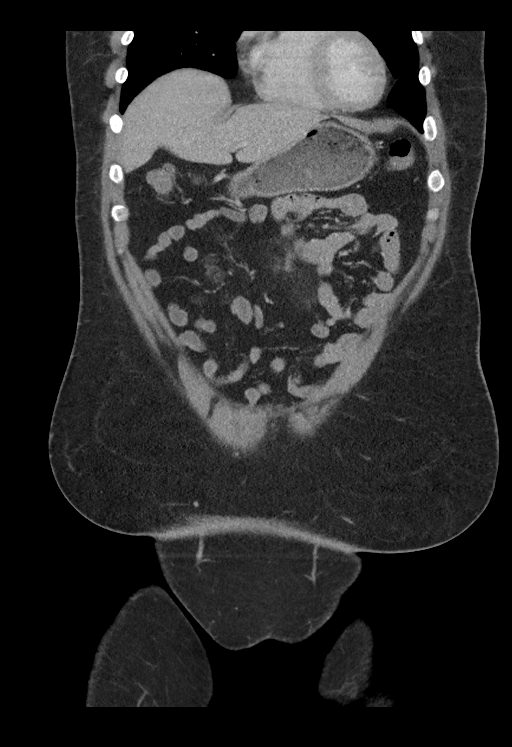
[im 49/111  soft-tissue]
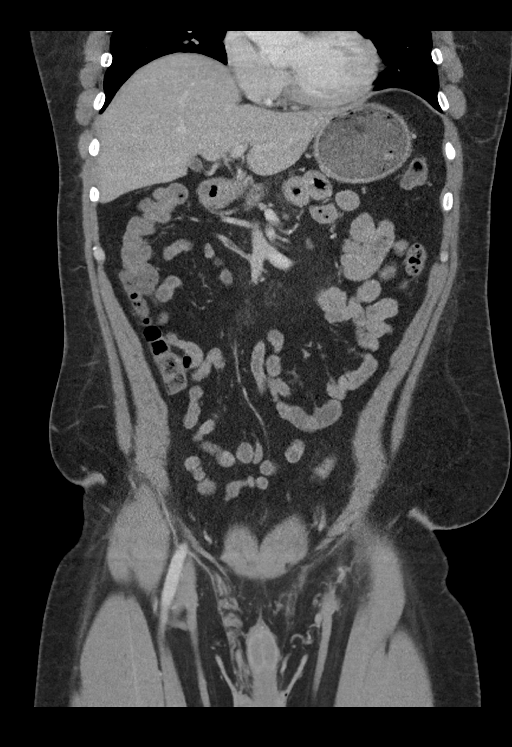
[im 62/111  soft-tissue]
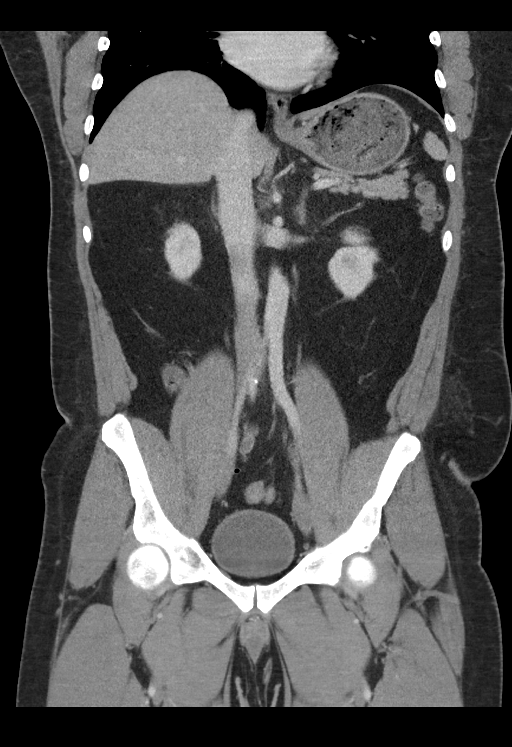

[15 of 46 positions shown; findings below may reference images not displayed]

FINDINGS: Lower chest: Linear atelectasis in both lower lobes. No pleural
fluid or confluent airspace disease.

Hepatobiliary: No focal liver abnormality is seen. No gallstones,
gallbladder wall thickening, or biliary dilatation.

Pancreas: No ductal dilatation or inflammation.

Spleen: Normal in size without focal abnormality. Small splenule
anteriorly.

Adrenals/Urinary Tract: Normal adrenal glands. No hydronephrosis or
perinephric edema. Homogeneous renal enhancement with symmetric
excretion on delayed phase imaging. Lobulated renal contours.
Urinary bladder is physiologically distended without wall
thickening.

Stomach/Bowel: Ingested material within the stomach. No small bowel
wall thickening, inflammatory change, or obstruction. Normal
appendix. Small volume of stool throughout the colon. No colonic
wall thickening. No colonic inflammation. No significant
diverticular disease.

Vascular/Lymphatic: Mild aorta bi-iliac atherosclerosis. No
aneurysm. Patent portal vein. Misty mesentery with small central
mesenteric lymph nodes, chronic and unchanged from prior exam. No
enlarged lymph nodes in the abdomen or pelvis.

Reproductive: Prostate is unremarkable.

Other: Small fat containing umbilical hernia. No free air, free
fluid, or intra-abdominal fluid collection.

Musculoskeletal: Stable vertebral body hemangioma within L5. There
are no acute or suspicious osseous abnormalities.
IMPRESSION: 1. No acute abnormality or explanation for abdominal pain.
2. Small fat containing umbilical hernia.

Aortic Atherosclerosis ([id number]).

## 2023-10-30 DIAGNOSIS — E785 Hyperlipidemia, unspecified: Secondary | ICD-10-CM | POA: Diagnosis not present

## 2023-10-30 DIAGNOSIS — E669 Obesity, unspecified: Secondary | ICD-10-CM | POA: Diagnosis not present

## 2023-10-30 DIAGNOSIS — I10 Essential (primary) hypertension: Secondary | ICD-10-CM | POA: Diagnosis not present

## 2023-10-30 DIAGNOSIS — E119 Type 2 diabetes mellitus without complications: Secondary | ICD-10-CM | POA: Diagnosis not present
